# Patient Record
Sex: Female | Born: 1935 | Race: Black or African American | Hispanic: No | Smoking: Never smoker
Health system: Southern US, Community
[De-identification: ages and names within clinical notes are randomized; demographics above are authoritative.]

## PROBLEM LIST (undated history)

## (undated) DIAGNOSIS — I2699 Other pulmonary embolism without acute cor pulmonale: Secondary | ICD-10-CM

## (undated) DIAGNOSIS — E119 Type 2 diabetes mellitus without complications: Secondary | ICD-10-CM

## (undated) DIAGNOSIS — G5602 Carpal tunnel syndrome, left upper limb: Secondary | ICD-10-CM

## (undated) DIAGNOSIS — M1811 Unilateral primary osteoarthritis of first carpometacarpal joint, right hand: Secondary | ICD-10-CM

## (undated) DIAGNOSIS — M5417 Radiculopathy, lumbosacral region: Secondary | ICD-10-CM

## (undated) DIAGNOSIS — M1990 Unspecified osteoarthritis, unspecified site: Secondary | ICD-10-CM

## (undated) DIAGNOSIS — I10 Essential (primary) hypertension: Secondary | ICD-10-CM

## (undated) DIAGNOSIS — K56609 Unspecified intestinal obstruction, unspecified as to partial versus complete obstruction: Secondary | ICD-10-CM

## (undated) DIAGNOSIS — D649 Anemia, unspecified: Secondary | ICD-10-CM

## (undated) DIAGNOSIS — M8580 Other specified disorders of bone density and structure, unspecified site: Secondary | ICD-10-CM

## (undated) DIAGNOSIS — C801 Malignant (primary) neoplasm, unspecified: Secondary | ICD-10-CM

## (undated) DIAGNOSIS — K8689 Other specified diseases of pancreas: Secondary | ICD-10-CM

## (undated) DIAGNOSIS — G5601 Carpal tunnel syndrome, right upper limb: Secondary | ICD-10-CM

## (undated) DIAGNOSIS — E7800 Pure hypercholesterolemia, unspecified: Secondary | ICD-10-CM

## (undated) DIAGNOSIS — C259 Malignant neoplasm of pancreas, unspecified: Secondary | ICD-10-CM

## (undated) DIAGNOSIS — E559 Vitamin D deficiency, unspecified: Secondary | ICD-10-CM

## (undated) DIAGNOSIS — M1710 Unilateral primary osteoarthritis, unspecified knee: Secondary | ICD-10-CM

## (undated) HISTORY — DX: Type 2 diabetes mellitus without complications: E11.9

## (undated) HISTORY — DX: Other pulmonary embolism without acute cor pulmonale: I26.99

## (undated) HISTORY — DX: Radiculopathy, lumbosacral region: M54.17

## (undated) HISTORY — DX: Anemia, unspecified: D64.9

## (undated) HISTORY — DX: Unilateral primary osteoarthritis of first carpometacarpal joint, right hand: M18.11

## (undated) HISTORY — DX: Carpal tunnel syndrome, left upper limb: G56.02

## (undated) HISTORY — DX: Unspecified intestinal obstruction, unspecified as to partial versus complete obstruction: K56.609

## (undated) HISTORY — DX: Other specified diseases of pancreas: K86.89

## (undated) HISTORY — DX: Unilateral primary osteoarthritis, unspecified knee: M17.10

## (undated) HISTORY — DX: Carpal tunnel syndrome, right upper limb: G56.01

## (undated) HISTORY — DX: Malignant neoplasm of pancreas, unspecified: C25.9

## (undated) HISTORY — DX: Essential (primary) hypertension: I10

## (undated) HISTORY — PX: ABDOMINAL HYSTERECTOMY: SHX81

## (undated) HISTORY — DX: Other specified disorders of bone density and structure, unspecified site: M85.80

## (undated) HISTORY — DX: Unspecified osteoarthritis, unspecified site: M19.90

## (undated) HISTORY — DX: Pure hypercholesterolemia, unspecified: E78.00

## (undated) HISTORY — DX: Vitamin D deficiency, unspecified: E55.9

---

## 2004-07-12 ENCOUNTER — Ambulatory Visit: Payer: Self-pay | Admitting: Gastroenterology

## 2005-07-07 ENCOUNTER — Ambulatory Visit: Payer: Self-pay | Admitting: Internal Medicine

## 2005-07-18 ENCOUNTER — Encounter: Payer: Self-pay | Admitting: Internal Medicine

## 2006-07-16 ENCOUNTER — Ambulatory Visit: Payer: Self-pay | Admitting: Internal Medicine

## 2007-07-19 ENCOUNTER — Ambulatory Visit: Payer: Self-pay | Admitting: Family Medicine

## 2007-08-03 ENCOUNTER — Ambulatory Visit: Payer: Self-pay | Admitting: Cardiology

## 2007-10-04 ENCOUNTER — Ambulatory Visit: Payer: Self-pay | Admitting: Gastroenterology

## 2008-07-20 ENCOUNTER — Ambulatory Visit: Payer: Self-pay | Admitting: Family Medicine

## 2009-07-23 ENCOUNTER — Ambulatory Visit: Payer: Self-pay | Admitting: Family Medicine

## 2010-07-30 ENCOUNTER — Ambulatory Visit: Payer: Self-pay | Admitting: Family Medicine

## 2011-08-27 ENCOUNTER — Ambulatory Visit: Payer: Self-pay | Admitting: Internal Medicine

## 2012-08-27 ENCOUNTER — Ambulatory Visit: Payer: Self-pay | Admitting: Internal Medicine

## 2012-12-18 ENCOUNTER — Ambulatory Visit: Payer: Self-pay | Admitting: Orthopedic Surgery

## 2012-12-18 ENCOUNTER — Emergency Department: Payer: Self-pay | Admitting: Emergency Medicine

## 2013-02-14 ENCOUNTER — Ambulatory Visit: Payer: Self-pay | Admitting: General Practice

## 2013-02-23 ENCOUNTER — Ambulatory Visit: Payer: Self-pay | Admitting: Gastroenterology

## 2013-07-29 ENCOUNTER — Ambulatory Visit: Payer: Self-pay | Admitting: Ophthalmology

## 2013-07-29 LAB — CREATININE, SERUM: Creatinine: 1.08 mg/dL (ref 0.60–1.30)

## 2013-09-06 ENCOUNTER — Ambulatory Visit: Payer: Self-pay | Admitting: Internal Medicine

## 2014-02-27 DIAGNOSIS — E119 Type 2 diabetes mellitus without complications: Secondary | ICD-10-CM | POA: Insufficient documentation

## 2014-02-27 HISTORY — DX: Type 2 diabetes mellitus without complications: E11.9

## 2014-09-12 DIAGNOSIS — M5417 Radiculopathy, lumbosacral region: Secondary | ICD-10-CM | POA: Insufficient documentation

## 2014-09-12 DIAGNOSIS — M25552 Pain in left hip: Secondary | ICD-10-CM | POA: Insufficient documentation

## 2014-09-12 HISTORY — DX: Radiculopathy, lumbosacral region: M54.17

## 2014-09-14 ENCOUNTER — Ambulatory Visit: Payer: Self-pay | Admitting: Internal Medicine

## 2015-01-26 NOTE — Consult Note (Signed)
PATIENT NAME:  NOHEMY, KOOP MR#:  [id number] DATE OF BIRTH:  [birth year]  DATE OF CONSULTATION:  12/18/2012  CONSULTING PHYSICIAN:  Mali E. Delitha Elms, MD  REASON FOR CONSULTATION: Right shoulder pain.   HISTORY OF PRESENT ILLNESS: This is a 80 year old female who fell and sustained injury to her right shoulder. She presented with pain and deformity. She complained of pain rated as a 7 out of 10, sharp in nature, exacerbated by movement of the arm and relieved by rest. She denies any injury to the other 3 extremities.  She did not have loss of consciousness. She did not strike her head. No other issues.   PAST MEDICAL HISTORY: Arthritis.   MEDICATIONS: See list in paper chart.   ALLERGIES: No known drug allergies.   SOCIAL HISTORY: The patient denies any alcohol or tobacco use.   FAMILY HISTORY: Noncontributory.   REVIEW OF SYSTEMS: No chest pain. No shortness of breath.   PHYSICAL EXAMINATION: GENERAL: The patient is well-appearing, well-nourished, in no acute distress.   EXTREMITIES: Examination of the right shoulder reveals obvious deformity with anterior/inferior prominence of the humeral head at the glenohumeral joint with a sulcus sign laterally. No erythema. She has tenderness to palpation diffusely about the shoulder. She has limited motion of the shoulder secondary to pain. She has full range of motion of the elbow, 5 out of 5 strength distally, intact sensation axillary, median, radial and ulnar nerve distributions, good capillary refill. Examination of the left shoulder was performed in a similar manner and was free of any abnormalities.   IMAGING: X-rays of the right shoulder revealed anterior dislocation of the shoulder.   IMPRESSION: A 80 year old female with a right shoulder anterior glenohumeral dislocation.   PLAN: The patient will undergo closed reduction in the Emergency Room. She will be placed in a sling. She will follow up with Orthopedics in a week or two.    DESCRIPTION OF PROCEDURE: The patient was seen and identified. The patient was given IV Dilaudid for pain control. Using some slow gentle traction with external rotation, the patient's humeral head was reduced into the glenoid without pain or difficulty. She tolerated this procedure very well. Post reduction radiographs revealed reduction of the glenohumeral joint as verified by the axillary view. She does have evidence of some glenohumeral arthritis with likely a rotator cuff arthropathy.   ____________________________ Mali E. Shantese Raven, MD ces:cb D: 12/19/2012 15:38:27 ET T: 12/19/2012 18:23:35 ET JOB#: [id number]  cc: Mali E. Shenicka Sunderlin, MD, <Dictator> Mali E Alson Mcpheeters MD ELECTRONICALLY SIGNED 01/20/2013 13:09

## 2015-01-27 ENCOUNTER — Inpatient Hospital Stay
Admission: EM | Admit: 2015-01-27 | Discharge: 2015-02-06 | DRG: 330 | Disposition: A | Discharge status: Skilled Nursing Facility | Payer: Medicare Other | Attending: Surgery | Admitting: Surgery

## 2015-01-27 ENCOUNTER — Ambulatory Visit
Admit: 2015-01-27 | Disposition: A | Discharge status: Home / Self Care | Payer: Self-pay | Attending: Family Medicine | Admitting: Family Medicine

## 2015-01-27 DIAGNOSIS — J9811 Atelectasis: Secondary | ICD-10-CM | POA: Diagnosis present

## 2015-01-27 DIAGNOSIS — Z8249 Family history of ischemic heart disease and other diseases of the circulatory system: Secondary | ICD-10-CM | POA: Diagnosis not present

## 2015-01-27 DIAGNOSIS — Z8711 Personal history of peptic ulcer disease: Secondary | ICD-10-CM

## 2015-01-27 DIAGNOSIS — Z90711 Acquired absence of uterus with remaining cervical stump: Secondary | ICD-10-CM | POA: Diagnosis present

## 2015-01-27 DIAGNOSIS — I10 Essential (primary) hypertension: Secondary | ICD-10-CM | POA: Diagnosis present

## 2015-01-27 DIAGNOSIS — K5660 Unspecified intestinal obstruction: Secondary | ICD-10-CM | POA: Diagnosis present

## 2015-01-27 DIAGNOSIS — A047 Enterocolitis due to Clostridium difficile: Secondary | ICD-10-CM | POA: Diagnosis present

## 2015-01-27 DIAGNOSIS — M1990 Unspecified osteoarthritis, unspecified site: Secondary | ICD-10-CM | POA: Diagnosis present

## 2015-01-27 DIAGNOSIS — K56609 Unspecified intestinal obstruction, unspecified as to partial versus complete obstruction: Secondary | ICD-10-CM | POA: Diagnosis present

## 2015-01-27 DIAGNOSIS — Z79899 Other long term (current) drug therapy: Secondary | ICD-10-CM | POA: Diagnosis not present

## 2015-01-27 DIAGNOSIS — Z9049 Acquired absence of other specified parts of digestive tract: Secondary | ICD-10-CM | POA: Diagnosis present

## 2015-01-27 DIAGNOSIS — K7689 Other specified diseases of liver: Secondary | ICD-10-CM | POA: Diagnosis present

## 2015-01-27 DIAGNOSIS — Z90710 Acquired absence of both cervix and uterus: Secondary | ICD-10-CM | POA: Diagnosis present

## 2015-01-27 DIAGNOSIS — Z85038 Personal history of other malignant neoplasm of large intestine: Secondary | ICD-10-CM

## 2015-01-27 LAB — URINALYSIS, COMPLETE
BACTERIA: NEGATIVE
BLOOD: NEGATIVE
GLUCOSE, UR: NEGATIVE
Nitrite: NEGATIVE
Ph: 5 (ref 5.0–8.0)
WBC UR: >30 /HPF (ref 0–5)

## 2015-01-27 LAB — CBC WITH DIFFERENTIAL/PLATELET
Basophil #: 0 10*3/uL (ref 0.0–0.1)
Basophil %: 0.2 %
EOS PCT: 0.1 %
Eosinophil #: 0 10*3/uL (ref 0.0–0.7)
HCT: 37.9 % (ref 35.0–47.0)
HGB: 12.4 g/dL (ref 12.0–16.0)
Lymphocyte #: 0.7 10*3/uL — ABNORMAL LOW (ref 1.0–3.6)
Lymphocyte %: 7.3 %
MCH: 27.6 pg (ref 26.0–34.0)
MCHC: 32.8 g/dL (ref 32.0–36.0)
MCV: 84 fL (ref 80–100)
MONOS PCT: 11.7 %
Monocyte #: 1.2 x10 3/mm — ABNORMAL HIGH (ref 0.2–0.9)
Neutrophil #: 8.3 10*3/uL — ABNORMAL HIGH (ref 1.4–6.5)
Neutrophil %: 80.7 %
Platelet: 286 10*3/uL (ref 150–440)
RBC: 4.5 10*6/uL (ref 3.80–5.20)
RDW: 14.3 % (ref 11.5–14.5)
WBC: 10.3 10*3/uL (ref 3.6–11.0)

## 2015-01-27 LAB — COMPREHENSIVE METABOLIC PANEL
ALBUMIN: 4.5 g/dL
Alkaline Phosphatase: 69 U/L
Anion Gap: 13 (ref 7–16)
BILIRUBIN TOTAL: 0.6 mg/dL
BUN: 46 mg/dL — ABNORMAL HIGH
CREATININE: 1.6 mg/dL — AB
Calcium, Total: 9.8 mg/dL
Chloride: 92 mmol/L — ABNORMAL LOW
Co2: 31 mmol/L
EGFR (African American): 36 — ABNORMAL LOW
EGFR (Non-African Amer.): 31 — ABNORMAL LOW
Glucose: 149 mg/dL — ABNORMAL HIGH
POTASSIUM: 3.5 mmol/L
SGOT(AST): 31 U/L
SGPT (ALT): 16 U/L
SODIUM: 136 mmol/L
TOTAL PROTEIN: 8.2 g/dL — AB

## 2015-01-27 LAB — PROTIME-INR
INR: 1
Prothrombin Time: 13.8 secs

## 2015-01-27 LAB — APTT: Activated PTT: <23 secs — ABNORMAL LOW (ref 23.6–35.9)

## 2015-01-28 LAB — BASIC METABOLIC PANEL
Anion Gap: 8 (ref 7–16)
BUN: 43 mg/dL — AB
CO2: 32 mmol/L
CREATININE: 1.32 mg/dL — AB
Calcium, Total: 8.8 mg/dL — ABNORMAL LOW
Chloride: 95 mmol/L — ABNORMAL LOW
EGFR (African American): 45 — ABNORMAL LOW
EGFR (Non-African Amer.): 39 — ABNORMAL LOW
Glucose: 142 mg/dL — ABNORMAL HIGH
Potassium: 3.5 mmol/L
Sodium: 135 mmol/L

## 2015-01-28 LAB — CBC WITH DIFFERENTIAL/PLATELET
Basophil #: 0 10*3/uL (ref 0.0–0.1)
Basophil %: 0.2 %
EOS ABS: 0.1 10*3/uL (ref 0.0–0.7)
Eosinophil %: 2 %
HCT: 35.7 % (ref 35.0–47.0)
HGB: 11.5 g/dL — ABNORMAL LOW (ref 12.0–16.0)
Lymphocyte #: 0.6 10*3/uL — ABNORMAL LOW (ref 1.0–3.6)
Lymphocyte %: 9.8 %
MCH: 27.5 pg (ref 26.0–34.0)
MCHC: 32.2 g/dL (ref 32.0–36.0)
MCV: 86 fL (ref 80–100)
MONO ABS: 1.2 x10 3/mm — AB (ref 0.2–0.9)
Monocyte %: 18.7 %
NEUTROS ABS: 4.5 10*3/uL (ref 1.4–6.5)
NEUTROS PCT: 69.3 %
PLATELETS: 265 10*3/uL (ref 150–440)
RBC: 4.17 10*6/uL (ref 3.80–5.20)
RDW: 14.2 % (ref 11.5–14.5)
WBC: 6.4 10*3/uL (ref 3.6–11.0)

## 2015-01-29 LAB — BASIC METABOLIC PANEL
ANION GAP: 11 (ref 7–16)
BUN: 29 mg/dL — ABNORMAL HIGH
CALCIUM: 8.6 mg/dL — AB
Chloride: 93 mmol/L — ABNORMAL LOW
Co2: 29 mmol/L
Creatinine: 0.97 mg/dL
EGFR (African American): >60
GFR CALC NON AF AMER: 56 — AB
GLUCOSE: 138 mg/dL — AB
Potassium: 3.4 mmol/L — ABNORMAL LOW
Sodium: 133 mmol/L — ABNORMAL LOW

## 2015-01-29 LAB — CBC WITH DIFFERENTIAL/PLATELET
Basophil #: 0 10*3/uL (ref 0.0–0.1)
Basophil %: 0.2 %
EOS PCT: 1.7 %
Eosinophil #: 0.1 10*3/uL (ref 0.0–0.7)
HCT: 34.5 % — AB (ref 35.0–47.0)
HGB: 11.3 g/dL — AB (ref 12.0–16.0)
Lymphocyte #: 0.7 10*3/uL — ABNORMAL LOW (ref 1.0–3.6)
Lymphocyte %: 10.9 %
MCH: 28.1 pg (ref 26.0–34.0)
MCHC: 32.8 g/dL (ref 32.0–36.0)
MCV: 86 fL (ref 80–100)
MONO ABS: 1.2 x10 3/mm — AB (ref 0.2–0.9)
Monocyte %: 18.9 %
NEUTROS PCT: 68.3 %
Neutrophil #: 4.4 10*3/uL (ref 1.4–6.5)
Platelet: 248 10*3/uL (ref 150–440)
RBC: 4.04 10*6/uL (ref 3.80–5.20)
RDW: 14.1 % (ref 11.5–14.5)
WBC: 6.4 10*3/uL (ref 3.6–11.0)

## 2015-01-30 LAB — CBC WITH DIFFERENTIAL/PLATELET
BASOS PCT: 0.2 %
Basophil #: 0 10*3/uL (ref 0.0–0.1)
EOS PCT: 0 %
Eosinophil #: 0 10*3/uL (ref 0.0–0.7)
HCT: 35.2 % (ref 35.0–47.0)
HGB: 11.7 g/dL — ABNORMAL LOW (ref 12.0–16.0)
LYMPHS ABS: 0.5 10*3/uL — AB (ref 1.0–3.6)
Lymphocyte %: 5 %
MCH: 28 pg (ref 26.0–34.0)
MCHC: 33.2 g/dL (ref 32.0–36.0)
MCV: 84 fL (ref 80–100)
MONO ABS: 1.8 x10 3/mm — AB (ref 0.2–0.9)
Monocyte %: 19.2 %
NEUTROS PCT: 75.6 %
Neutrophil #: 6.9 10*3/uL — ABNORMAL HIGH (ref 1.4–6.5)
Platelet: 273 10*3/uL (ref 150–440)
RBC: 4.17 10*6/uL (ref 3.80–5.20)
RDW: 13.8 % (ref 11.5–14.5)
WBC: 9.1 10*3/uL (ref 3.6–11.0)

## 2015-01-30 LAB — BASIC METABOLIC PANEL
ANION GAP: 10 (ref 7–16)
BUN: 27 mg/dL — AB
Calcium, Total: 8.8 mg/dL — ABNORMAL LOW
Chloride: 95 mmol/L — ABNORMAL LOW
Co2: 27 mmol/L
Creatinine: 1.15 mg/dL — ABNORMAL HIGH
EGFR (African American): 53 — ABNORMAL LOW
EGFR (Non-African Amer.): 46 — ABNORMAL LOW
GLUCOSE: 174 mg/dL — AB
POTASSIUM: 4 mmol/L
Sodium: 132 mmol/L — ABNORMAL LOW

## 2015-01-31 LAB — BASIC METABOLIC PANEL
Anion Gap: 7 (ref 7–16)
BUN: 28 mg/dL — ABNORMAL HIGH
Calcium, Total: 8.2 mg/dL — ABNORMAL LOW
Chloride: 100 mmol/L — ABNORMAL LOW
Co2: 28 mmol/L
Creatinine: 1.19 mg/dL — ABNORMAL HIGH
EGFR (African American): 51 — ABNORMAL LOW
EGFR (Non-African Amer.): 44 — ABNORMAL LOW
Glucose: 142 mg/dL — ABNORMAL HIGH
Potassium: 4.4 mmol/L
Sodium: 135 mmol/L

## 2015-02-01 LAB — CBC WITH DIFFERENTIAL/PLATELET
BASOS PCT: 0.2 %
Basophil #: 0 10*3/uL (ref 0.0–0.1)
EOS ABS: 0.1 10*3/uL (ref 0.0–0.7)
EOS PCT: 2.1 %
HCT: 29.2 % — AB (ref 35.0–47.0)
HGB: 9.3 g/dL — AB (ref 12.0–16.0)
LYMPHS ABS: 0.4 10*3/uL — AB (ref 1.0–3.6)
Lymphocyte %: 5.9 %
MCH: 27.4 pg (ref 26.0–34.0)
MCHC: 31.9 g/dL — AB (ref 32.0–36.0)
MCV: 86 fL (ref 80–100)
MONO ABS: 1.4 x10 3/mm — AB (ref 0.2–0.9)
Monocyte %: 19.9 %
NEUTROS ABS: 5.1 10*3/uL (ref 1.4–6.5)
Neutrophil %: 71.9 %
Platelet: 259 10*3/uL (ref 150–440)
RBC: 3.39 10*6/uL — AB (ref 3.80–5.20)
RDW: 13.8 % (ref 11.5–14.5)
WBC: 7.1 10*3/uL (ref 3.6–11.0)

## 2015-02-01 LAB — BASIC METABOLIC PANEL
Anion Gap: 5 — ABNORMAL LOW (ref 7–16)
BUN: 19 mg/dL
CO2: 28 mmol/L
CREATININE: 0.87 mg/dL
Calcium, Total: 8.2 mg/dL — ABNORMAL LOW
Chloride: 106 mmol/L
Glucose: 153 mg/dL — ABNORMAL HIGH
POTASSIUM: 4.9 mmol/L
Sodium: 139 mmol/L

## 2015-02-03 DIAGNOSIS — K56609 Unspecified intestinal obstruction, unspecified as to partial versus complete obstruction: Secondary | ICD-10-CM | POA: Diagnosis present

## 2015-02-03 HISTORY — DX: Unspecified intestinal obstruction, unspecified as to partial versus complete obstruction: K56.609

## 2015-02-03 LAB — CBC WITH DIFFERENTIAL/PLATELET
Basophil #: 0 10*3/uL (ref 0.0–0.1)
Basophil %: 0.1 %
EOS PCT: 3.3 %
Eosinophil #: 0.3 10*3/uL (ref 0.0–0.7)
HCT: 28.3 % — AB (ref 35.0–47.0)
HGB: 8.9 g/dL — ABNORMAL LOW (ref 12.0–16.0)
LYMPHS PCT: 7 %
Lymphocyte #: 0.6 10*3/uL — ABNORMAL LOW (ref 1.0–3.6)
MCH: 27.1 pg (ref 26.0–34.0)
MCHC: 31.6 g/dL — ABNORMAL LOW (ref 32.0–36.0)
MCV: 86 fL (ref 80–100)
MONOS PCT: 16.4 %
Monocyte #: 1.4 x10 3/mm — ABNORMAL HIGH (ref 0.2–0.9)
Neutrophil #: 6.3 10*3/uL (ref 1.4–6.5)
Neutrophil %: 73.2 %
PLATELETS: 261 10*3/uL (ref 150–440)
RBC: 3.29 10*6/uL — ABNORMAL LOW (ref 3.80–5.20)
RDW: 13.9 % (ref 11.5–14.5)
WBC: 8.6 10*3/uL (ref 3.6–11.0)

## 2015-02-03 LAB — BASIC METABOLIC PANEL
ANION GAP: 4 — AB (ref 7–16)
BUN: 8 mg/dL
CHLORIDE: 106 mmol/L
CREATININE: 0.69 mg/dL
Calcium, Total: 8.3 mg/dL — ABNORMAL LOW
Co2: 26 mmol/L
EGFR (Non-African Amer.): >60
Glucose: 125 mg/dL — ABNORMAL HIGH
Potassium: 4.1 mmol/L
Sodium: 136 mmol/L

## 2015-02-03 MED ORDER — SODIUM CHLORIDE 0.9 % IJ SOLN: 3.0000 mL | INTRAMUSCULAR | Status: DC | PRN

## 2015-02-03 MED ORDER — SODIUM CHLORIDE 0.9 % IJ SOLN: 5.0000 mL | INTRAMUSCULAR | Status: DC | PRN

## 2015-02-03 MED ORDER — KCL IN DEXTROSE-NACL 20-5-0.9 MEQ/L-%-% IV SOLN
INTRAVENOUS | Status: DC
  Administered 2015-02-04 – 2015-02-05 (×3): via INTRAVENOUS
  Filled 2015-02-03 (×7): qty 1000

## 2015-02-03 MED ORDER — MENTHOL 3 MG MT LOZG: 1.0000 | LOZENGE | OROMUCOSAL | Status: DC | PRN

## 2015-02-03 MED ORDER — ACETAMINOPHEN 325 MG PO TABS
650.0000 mg | ORAL_TABLET | Freq: Four times a day (QID) | ORAL | Status: DC | PRN
  Administered 2015-02-05: 650 mg via ORAL
  Filled 2015-02-03: qty 2

## 2015-02-03 MED ORDER — SODIUM CHLORIDE 0.9 % IJ SOLN: 10.0000 mL | INTRAMUSCULAR | Status: DC | PRN

## 2015-02-03 MED ORDER — ENOXAPARIN SODIUM 40 MG/0.4ML ~~LOC~~ SOLN
40.0000 mg | SUBCUTANEOUS | Status: DC
  Administered 2015-02-04 – 2015-02-06 (×3): 40 mg via SUBCUTANEOUS
  Filled 2015-02-03 (×3): qty 0.4

## 2015-02-03 MED ORDER — METOPROLOL TARTRATE 1 MG/ML IV SOLN
2.5000 mg | Freq: Three times a day (TID) | INTRAVENOUS | Status: DC
  Administered 2015-02-04 – 2015-02-05 (×4): 2.5 mg via INTRAVENOUS
  Filled 2015-02-03 (×4): qty 5

## 2015-02-03 MED ORDER — ONDANSETRON HCL 4 MG/2ML IJ SOLN: 4.0000 mg | INTRAMUSCULAR | Status: DC | PRN

## 2015-02-03 MED ORDER — POTASSIUM CHLORIDE 2 MEQ/ML IV SOLN: INTRAVENOUS | Status: DC

## 2015-02-03 MED ORDER — FAMOTIDINE IN NACL 20-0.9 MG/50ML-% IV SOLN
20.0000 mg | Freq: Two times a day (BID) | INTRAVENOUS | Status: DC
  Filled 2015-02-03 (×2): qty 50

## 2015-02-03 MED ORDER — HYDROMORPHONE HCL 1 MG/ML IJ SOLN: 0.5000 mg | INTRAMUSCULAR | Status: DC | PRN

## 2015-02-03 MED ORDER — SODIUM CHLORIDE 0.9 % IJ SOLN: 10.0000 mL | Freq: Two times a day (BID) | INTRAMUSCULAR | Status: DC

## 2015-02-03 MED ORDER — SODIUM CHLORIDE 0.9 % IJ SOLN
5.0000 mL | Freq: Two times a day (BID) | INTRAMUSCULAR | Status: DC
  Administered 2015-02-04 – 2015-02-06 (×5): 5 mL via INTRAVENOUS

## 2015-02-04 MED ORDER — FAMOTIDINE PREMIXED 20-0.9 MG/50ML-% IV SOLN
20.0000 mg | Freq: Two times a day (BID) | INTRAVENOUS | Status: DC
  Administered 2015-02-04: 20 mg via INTRAVENOUS
  Filled 2015-02-04 (×2): qty 50

## 2015-02-04 NOTE — Progress Notes (Signed)
Surgery  POD 5  S/P ex lap, small bowel bypass for SBO  No c/o's some inc pain.  No CP/SOB. Family concerned about stability walking and safety if goes home with her sister.  Filed Vitals:   02/02/15 1403 02/04/15 0420 02/04/15 0859  BP:  131/76 112/76  Pulse:   116  Temp:  98.4 F (36.9 C) 97.6 F (36.4 C)  TempSrc:  Oral Oral  Resp:  20 20  Height: 5' 5.98" (1.676 m)    Weight: 80.1 kg (176 lb 9.4 oz)    SpO2:   98%      Good UOP Lungs clear abd soft, wound ok, no drainage  Labs as above.  IMP  Making good progress  PLan  Advance diet CM to see in am for possible rehab placement. Discussed with family in detail.

## 2015-02-04 NOTE — Op Note (Addendum)
PATIENT NAME:  Carolyn Adkins, Carolyn Adkins MR#:  [id number] DATE OF BIRTH:  Dec 01, 1934  DATE OF PROCEDURE:  01/30/2015  PREOPERATIVE DIAGNOSIS: Small bowel obstruction.   POSTOPERATIVE DIAGNOSIS: Small bowel obstruction.   PROCEDURE PERFORMED: Lysis of adhesions and small bowel bypass.  SURGEON: Rodena Goldmann, MD   ANESTHESIA: General.   OPERATIVE PROCEDURE: With the patient in the supine position, after the induction of appropriate general anesthesia, the patient's abdomen was prepped with Betadine and draped in sterile towels. An alcohol wipe and Betadine impregnated Steri-Drape were utilized. A Foley catheter previously had been placed. A midline incision was made from just above the umbilicus to just below the umbilicus and carried down through the subcutaneous tissue with Bovie electrocautery. Midline fascia was identified and opened the length of the skin incision as was the peritoneum. Multiple adhesions were encountered. There was significant size mismatch between the proximal and distal bowel. Following the bowel distally there was adhesion to the anterior abdominal wall with small bowel twisted about the adhesion and multiple lesions of the pelvis. The bowel was lysed off the abdominal wall and the pelvic adhesions were removed. The bowel was then brought up into the abdominal cavity and run from the ileocecal valve to the ligament of Treitz. The area of obstruction appeared to be chronic with scarring across that segment of bowel. We elected to do a bypass on that segment of bowel rather than a resection. A small enterotomy was made in each loop of bowel and the GIA-55 stapling device inserted, 1 limb in each loop of bowel. The stapler was approximated and fired. The enterotomy was closed with 2-layer closure running 3-0 Vicryl suture and seromuscular sutures of 3-0 silk. An apex suture of 3-0 silk was placed. Bowel content was returned to their anatomic position. The abdomen was copiously irrigated. Gown,  gloves, instruments and drapes were changed. A nasogastric tube was appropriately positioned. Midline fascia was closed with figure-of-eight suture of 0 Maxon. A drain was placed in the bed of the incision and the skin clipped. Sterile dressings were applied.   Attention was then turned to the patient's right neck where a right internal jugular central venous catheter was inserted using ultrasound guidance on a single pass. The catheter was sutured in place, dressed sterilely and flushed. The patient was then awakened and returned to the recovery room having tolerated the procedure well. Sponge, instrument and needle counts were correct x2 in the operating room.   ____________________________ Micheline Maze, MD rle:sb D: 01/30/2015 13:30:35 ET T: 01/30/2015 14:20:09 ET JOB#: [id number]  cc: Micheline Maze, MD, <Dictator> Christena Flake. Raechel Ache, MD Rodena Goldmann MD ELECTRONICALLY SIGNED 02/19/2015 14:30

## 2015-02-04 NOTE — Clinical Social Work Note (Signed)
Clinical Social Work Assessment  Patient Details  Name: Carolyn Adkins MRN: [id number] Date of Birth: [birth year]  Date of referral:  02/04/15               Reason for consult:  Facility Placement                Permission sought to share information with:  Family Supports Permission granted to share information::  Yes, Verbal Permission Granted  Name::        Agency::     Relationship::     Contact Information:     Housing/Transportation Living arrangements for the past 2 months:  Single Family Home Source of Information:  Patient Patient Interpreter Needed:  None Criminal Activity/Legal Involvement Pertinent to Current Situation/Hospitalization:  No - Comment as needed Significant Relationships:  Siblings, Adult Children Lives with:  Self Do you feel safe going back to the place where you live?  Yes Need for family participation in patient care:  No (Coment)  Care giving concerns:     Facilities manager / plan:  CSW met with pt re: PT recommendation for SNF. 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 CSW explained placement process and answered questions. Pt prefers CSW initiate SNF search in [place] and [place] in [place]. Weekday CSW to f/u with offers.   Employment status:  Part-Time Nurse, adult PT Recommendations:  [place] / Referral to community resources:     Patient/Family's Response to care:  Pt reports agreeable to above plan and that her family is aware of d/c plan to SNF.  Patient/Family's Understanding of and Emotional Response to Diagnosis, Current Treatment, and Prognosis:    Emotional Assessment Appearance:    Attitude/Demeanor/Rapport:    Affect (typically observed):  Accepting, Adaptable, Appropriate, Calm Orientation:  Oriented to Self, Oriented to Place, Oriented to  Time, Oriented to Situation Alcohol / Substance use:  Never Used Psych involvement (Current  and /or in the community):  No (Comment)  Discharge Needs  Concerns to be addressed:  Discharge Planning Concerns Readmission within the last 30 days:  No Current discharge risk:  None Barriers to Discharge:  Other (medical clearance)   [name], MSW, LCSW [phone] (weekend coverage)       Amador Cunas, [place] 02/04/2015, 3:03 PM

## 2015-02-04 NOTE — Care Management Note (Signed)
Case Management Note  Patient Details  Name: Carolyn Adkins MRN: [id number] Date of Birth: 03-24-1935  Subjective/Objective:         LTAC Referral           Action/Plan:  Carolyn Adkins will provide an LTAC evaluation for [facility] on 02/05/15.   Expected Discharge Date:   undecided               Expected Discharge Plan:     In-House Referral:     Discharge planning Services     Post Acute Care Choice:    Choice offered to:     DME Arranged:    DME Agency:     HH Arranged:    [place] Agency:     Status of Service:     Medicare Important Message Given:    Date Medicare IM Given:    Medicare IM give by:    Date Additional Medicare IM Given:    Additional Medicare Important Message give by:     If discussed at [place] of Stay Meetings, dates discussed:    Additional Comments:  Carolyn Adkins A, RN 02/04/2015, 1:52 PM

## 2015-02-04 NOTE — Consult Note (Signed)
PATIENT NAME:  Carolyn, Adkins MR#:  [id number] DATE OF BIRTH:  July 03, 1935  DATE OF CONSULTATION:  01/29/2015  REFERRING PHYSICIAN:  Dr. Pat Patrick CONSULTING PHYSICIAN:  Desyre Calma R. Evann Koelzer, MD  REASON FOR CONSULTATION: Preoperative evaluation, hypertension.   PRIMARY CARE PROVIDER: Christena Flake. [facility], MD   HISTORY OF PRESENTING ILLNESS: A 80 year old African American female patient with history of colon polyps status post colectomy 2004, hypertension, hysterectomy, presented to the Emergency Room 2 days prior on 01/27/2015 with complaints of 2 days of nausea and vomiting. The patient was found to have small bowel obstruction and admitted onto the surgical service by Dr. Marina Gravel. The patient presently has an NG tube. Does not complain of any pain, but with no improvement in her abdominal x-ray. Plan is to take her to the Operating Room by Dr. Pat Patrick. A consult has been placed for hospitalist team for preoperative evaluation and risk assessment.   Presently, the patient seems comfortable. Has been confused since receiving her morphine. Most of the history has been obtained from her daughter over the phone, old records, and reviewing the chart. The patient does not complain of any shortness of breath or chest pain. Seems to ambulate on her own without any shortness of breath or chest pain at baseline. Continues to work.   Since admission, the patient has slowly trended up with her heart rate and presently is in the 130s. Blood pressure is in the normal range at 125/85 and saturating to 93% on room air.   PAST MEDICAL HISTORY: 1.  Hypertension.  2.  Arthritis.  3.  Hysterectomy.  4.  Appendectomy.  5.  Right colectomy in 2004 for a colon polyp.   ALLERGIES: No known drug allergies.   HOME MEDICATIONS:  1.  Vitamins D3 at 400 international units daily.  2.  Omeprazole 20 mg daily.  3.  Multivitamin 1 tablet daily.  4.  Diclofenac sodium 75 mg 2 times a day.  5.  Calcium vitamin D 1 tablet daily.  6.  Atenolol  chlorthalidone 1 tablet oral once a day.  7.  Acetaminophen 500 mg every 8 hours as needed.   FAMILY HISTORY: Hypertension and CAD.   REVIEW OF SYSTEMS: Unobtainable secondary to the patient's confusion and encephalopathy.   SOCIAL HISTORY: The patient does not smoke. No alcohol. No illicit drugs. Lives at home, continues to work, independent with activities of daily living.   CODE STATUS: Full code.   PHYSICAL EXAMINATION: VITAL SIGNS: Temperature 99, heart rate of 138, blood pressure 125/85, saturating 93% on room air.  GENERAL: Obese African American female patient lying in bed, overall seems comfortable.  PSYCHIATRIC: Alert, awake, pleasantly confused.  HEENT: Atraumatic, normocephalic. Oral mucosa dry and pink. External ears and nose normal. Has an NG tube in place.  NECK: Supple. No thyromegaly. No palpable lymph nodes. Trachea midline. No carotid bruit or JVD.  CARDIOVASCULAR: S1, S2, tachycardic. No murmurs. Peripheral pulses 2+. No edema.  RESPIRATORY: Normal work of breathing. Has some decreased air entry at the right base.  GASTROINTESTINAL: Soft abdomen, nontender. Bowel sounds absent. No hepatosplenomegaly palpable. Scar from prior surgery.  GENITOURINARY: No CVA tenderness or bladder distention.  SKIN: Warm and dry. No petechia, rash.  MUSCULOSKELETAL: No joint swelling, redness, effusion of the large joints. Normal muscle tone.  NEUROLOGICAL: Motor strength 5/5 in upper and lower extremities. Sensation to fine touch intact all over.  LYMPHATIC: No cervical lymphadenopathy.    LABORATORY STUDIES: Glucose of 138, BUN 29, creatinine 0.97 with  sodium 133, potassium 3.4, chloride 93, and GFR of 56, initial creatinine of 1.6.   WBC 6.4, hemoglobin 11.3, platelets of 248,000.   Urinalysis showed no bacteria.   CT scan of the abdomen and x-ray of the abdomen have been reviewed, shows small bowel obstruction.   ASSESSMENT AND PLAN: 1.  Small bowel obstruction. The patient  does not have any cardiac or lung issues. Seems to have good functional status at baseline. At this point, she should be a low risk for surgery, but we will get an EKG and a chest x-ray. If these are normal, the patient can go to the Operating Room. I have ordered these studies and will follow up on them. Discussed the complications with her daughter over the phone.  2.  Hypertension and tachycardia. The patient is on atenolol at home. We will put her on IV Lopressor as she is n.p.o. and monitor her heart rate.  3.  Acute renal failure has resolved with IV fluids. Continue fluids.  4.  Acute encephalopathy secondary to her morphine. The patient should be on fall precautions. We will get a CT scan of the head if there is no improvement.  5.  Deep vein thrombosis prophylaxis. The patient is on Lovenox.   CODE STATUS: Full code.   TIME SPENT ON THIS CONSULTATION: 45 minutes.  EKG and CXR reviewed. Nothing acute. Can proceed with surgery.   ____________________________ Carolyn Adkins Maryana Pittmon, MD srs:at D: 01/29/2015 19:17:53 ET T: 01/30/2015 10:39:37 ET JOB#: [id number]  cc: Carolyn Heimlich R. Ameerah Huffstetler, MD, <Dictator> Carolyn Flake. Raechel Ache, MD Carolyn Maze, MD Carolyn Carp MD ELECTRONICALLY SIGNED 01/30/2015 19:57

## 2015-02-04 NOTE — Clinical Social Work Placement (Signed)
   CLINICAL SOCIAL WORK PLACEMENT  NOTE  Date:  02/04/2015  Patient Details  Name: Carolyn Adkins MRN: [id number] Date of Birth: 05-06-1935  Clinical Social Work is seeking post-discharge placement for this patient at the [facility] level of care (*CSW will initial, date and re-position this form in  chart as items are completed):  Yes   Patient/family provided with [facility] Work Department's list of facilities offering this level of care within the geographic area requested by the patient (or if unable, by the patient's family).  Yes   Patient/family informed of their freedom to choose among providers that offer the needed level of care, that participate in Medicare, Medicaid or managed care program needed by the patient, have an available bed and are willing to accept the patient.  Yes   Patient/family informed of [facility]'s ownership interest in [facility] and [facility], as well as of the fact that they are under no obligation to receive care at these facilities.  PASRR submitted to EDS on 02/04/15     PASRR number received on 02/04/15     Existing PASRR number confirmed on       FL2 transmitted to all facilities in geographic area requested by pt/family on 02/04/15     FL2 transmitted to all facilities within larger geographic area on       Patient informed that his/her managed care company has contracts with or will negotiate with certain facilities, including the following:            Patient/family informed of bed offers received.  Patient chooses bed at       Physician recommends and patient chooses bed at      Patient to be transferred to   on  .  Patient to be transferred to facility by       Patient family notified on   of transfer.  Name of family member notified:        PHYSICIAN       Additional Comment:    _______________________________________________ Amador Cunas, LCSW 02/04/2015, 3:06  PM

## 2015-02-04 NOTE — H&P (Signed)
Subjective/Chief Complaint nausea and vomiting   History of Present Illness 80 y.o female presents with abou 2 days of n/v, last flatus this am. Prior right colectomy for polyp 2004 as well as a hysterectomy in past,  No prior episodes of SBO.  Imaging shows transition point in RLQ,  Surgery asked to evaluate for admission No fevers, no jaundice, no diarrhea.   Past History colon polyp right colectomy 2004 hysterectomy.   Past Medical Health Hypertension   Primary Physician [facility]. [place].   Code Status Full Code   Past Med/Surgical Hx:  HTN:   Arthritis:   Hysterectomy - Partial:   Appendectomy:   ALLERGIES:  No Known Allergies:    Other Allergies none.   HOME MEDICATIONS: Medication Instructions Status  atenolol-chlorthalidone 50 mg-25 mg oral tablet 1 tab(s) orally once a day Active  diclofenac sodium 75 mg oral delayed release tablet 1 tab(s) orally 2 times a day Active  multivitamin   once a day Active  Vitamin D3 400 intl units oral capsule 1 cap(s) orally once a day Active   Family and Social History:  Family History Hypertension   Social History negative tobacco, negative ETOH   Place of Living Home   Review of Systems:  Subjective/Chief Complaint see abovem remaining 10 point ROS negative.   Abdominal Pain Yes   Nausea/Vomiting Yes   Tolerating Diet No  Nauseated  Vomiting   Medications/Allergies Reviewed Medications/Allergies reviewed   Physical Exam:  GEN no acute distress, obese, disheveled, temp 97.7 p 60 bp 110/68   HEENT pale conjunctivae, PERRL   NECK supple   RESP normal resp effort  clear BS   CARD regular rate   ABD denies tenderness  no liver/spleen enlargement  no hernia  soft  distended   LYMPH negative neck   EXTR negative cyanosis/clubbing, negative edema   SKIN normal to palpation   NEURO cranial nerves intact   PSYCH A+O to time, place, person, good insight   Lab Results: Routine Coag:   23-Apr-16 14:48   Prothrombin 13.8 (11.4-15.0 NOTE: New Reference Range  11/03/14)  INR 1.0 (INR reference interval applies to patients on anticoagulant therapy. A single INR therapeutic range for coumarins is not optimal for all indications; however, the suggested range for most indications is 2.0 - 3.0. Exceptions to the INR Reference Range may include: Prosthetic heart valves, acute myocardial infarction, prevention of myocardial infarction, and combinations of aspirin and anticoagulant. The need for a higher or lower target INR must be assessed individually. Reference: The Pharmacology and Management of the Vitamin K  antagonists: the seventh ACCP Conference on Antithrombotic and Thrombolytic Therapy. FSFSE.3953 Sept:126 (3suppl): [id number]. A HCT value >55% may artifactually increase the PT.  In one study,  the increase was an average of 25%. Reference:  "Effect on Routine and Special Coagulation Testing Values of Citrate Anticoagulant Adjustment in Patients with High HCT Values." American Journal of Clinical Pathology 2006;126:400-405.)  Activated PTT (APTT)  < 23.0 (A HCT value >55% may artifactually increase the APTT. In one study, the increase was an average of 19%. Reference: "Effect on Routine and Special Coagulation Testing Values of Citrate Anticoagulant Adjustment in Patients with High HCT Values." American Journal of Clinical Pathology 2006;126:400-405.)   Radiology Results: LabUnknown:    23-Apr-16 14:38, CT Abdomen and Pelvis With Contrast  PACS Image  CT:  CT Abdomen and Pelvis With Contrast  REASON FOR EXAM:    (1) pain; (2) pain  COMMENTS:  PROCEDURE: CT  - CT ABDOMEN / PELVIS  W  - Jan 27 2015  2:38PM     CLINICAL DATA:  Abdominal pain and distention, small bowel  obstruction, nausea and vomiting for 3 days. Prior hysterectomy,  appendectomy, colon cancer.    EXAM:  CT ABDOMEN AND PELVIS WITH CONTRAST    TECHNIQUE:  Multidetector CT imaging of  the abdomen and pelvis was performed  using the standard protocol following bolus administration of  intravenous contrast.    CONTRAST: 75 cc Isovue 300 IV contrast    COMPARISON:  Abdominal radiographs same date. No prior CT  comparison.    FINDINGS:  Lower chest: Bibasilar bronchial wall thickening and areas of  curvilinear scarring or atelectasis are noted.    Hepatobiliary: 1 cmcyst at the dome of the lateral segment left  hepatic lobe is identified image 15. Mild central intrahepatic  ductal prominence is identified. Gallbladder is unremarkable.  Pancreas: Partly atrophic pancreas appears normal otherwise.    Spleen: Normal    Adrenals/Urinary Tract: Adrenal glands are normal. 0.9 cm anterior  mid right renal cortical cyst image 28. Areas of mild bilateral  renal cortical scarring. Left kidney otherwise unremarkable. No  hydroureteronephrosis.    Stomach/Bowel: There is diffuse proximal to mid small bowel  dilatation to a maximal caliber of 4.5 cm image 60. Transition point  is identified at the approximate level of the proximal ileum, with  approximate transition point at the anterior mid abdomen, subjacent  to the anterior abdominal wall, image 56. Most likely etiology is  adhesion from adjacent previous midline surgical incision. Terminal  ileum and large bowel are decompressed.    Vascular/Lymphatic: No atheromatous scratch but no aortic aneurysm.  Small retroperitoneal nodes are identified, largest 0.6 cm image 36.    Reproductive: Uterus surgically absent.  No adnexal mass.    Other: No free air.  A small amount of free fluid is identified.    Musculoskeletal: No acute osseous abnormality.     IMPRESSION:  High-grade small bowel obstruction with transition point at the  approximate level of the proximal ileum, difficult to visualize with  certainty but most likely subjacent to the anterior abdominal wall  midline and most likely etiology due to adhesion from  previous  midline surgical incision.    Small free fluid in the pelvis.  No free air.      Electronically Signed    By: Conchita Paris M.D.    On: 01/27/2015 14:52         Verified By: Arline Asp, M.D.,    Assessment/Admission Diagnosis 80 y.o female with adhesive SBO Hypertension Mild azotemia. hypokalemic metabolic alkalosis from vomiting.   Plan Admit NPO NGT Hydrate am films.  CT scan images personally reviewed on PACS monitor.   Electronic Signatures: Sherri Rad (MD)  (Signed 23-Apr-16 15:57)  Authored: CHIEF COMPLAINT and HISTORY, PAST MEDICAL/SURGIAL HISTORY, ALLERGIES, Other Allergies, HOME MEDICATIONS, FAMILY AND SOCIAL HISTORY, REVIEW OF SYSTEMS, PHYSICAL EXAM, LABS, Radiology, ASSESSMENT AND PLAN   Last Updated: 23-Apr-16 15:57 by Sherri Rad (MD)

## 2015-02-05 LAB — CLOSTRIDIUM DIFFICILE BY PCR: Toxigenic C. Difficile by PCR: POSITIVE — AB

## 2015-02-05 LAB — C DIFFICILE QUICK SCREEN W PCR REFLEX
C Diff antigen: POSITIVE
C Diff toxin: NEGATIVE

## 2015-02-05 MED ORDER — HYDROCHLOROTHIAZIDE 25 MG PO TABS: 25.0000 mg | ORAL_TABLET | Freq: Two times a day (BID) | ORAL | Status: DC

## 2015-02-05 MED ORDER — ATENOLOL 50 MG PO TABS
50.0000 mg | ORAL_TABLET | Freq: Every day | ORAL | Status: DC
  Administered 2015-02-05 – 2015-02-06 (×2): 50 mg via ORAL
  Filled 2015-02-05 (×2): qty 1

## 2015-02-05 MED ORDER — HYDROCHLOROTHIAZIDE 25 MG PO TABS
25.0000 mg | ORAL_TABLET | Freq: Every day | ORAL | Status: DC
  Administered 2015-02-05: 25 mg via ORAL
  Filled 2015-02-05: qty 1

## 2015-02-05 MED ORDER — ATENOLOL 50 MG PO TABS: 50.0000 mg | ORAL_TABLET | Freq: Two times a day (BID) | ORAL | Status: DC

## 2015-02-05 MED ORDER — METRONIDAZOLE 500 MG PO TABS
500.0000 mg | ORAL_TABLET | Freq: Three times a day (TID) | ORAL | Status: DC
  Administered 2015-02-05 – 2015-02-06 (×4): 500 mg via ORAL
  Filled 2015-02-05 (×4): qty 1

## 2015-02-05 NOTE — Clinical Social Work Note (Signed)
Patient has had bed offers extended to her and she has requested I contacted her sister: Barbaraann Share: [phone]. Barbaraann Share chose [facility] and patient is in agreement with this. [facility] informed of acceptance of offer. Will facilitate discharge when time.

## 2015-02-05 NOTE — Progress Notes (Signed)
Physical Therapy Treatment Patient Details Name: Carolyn Adkins MRN: [id number] DOB: Feb 20, 1935 Today's Date: 02/05/2015    History of Present Illness 80 yo female with onset of SBO with findings of liver cyst, bibasilar atelectasis, bronchial wall thickening is referred to PT and planned for SNF.  PMHx:  colon CA, R colectomy, colon polyp, HTN    PT Comments    Pt is having a limited day for effort but did start gait with PT.  Her tolerance is reduced by abd pain and did ask nursing to medicate her.  SNF plans are in place and SW was in to get her input on a facility.  WIll focus on ROM to legs and standing tolerance/gait next visit.  Follow Up Recommendations  SNF;Supervision/Assistance - 24 hour     Equipment Recommendations  Rolling walker with 5" wheels    Recommendations for Other Services       Precautions / Restrictions Precautions Precautions: Fall Restrictions Weight Bearing Restrictions: No    Mobility  Bed Mobility Overal bed mobility: Needs Assistance Bed Mobility: Supine to Sit;Sit to Supine     Supine to sit: Mod assist Sit to supine: Mod assist   General bed mobility comments: Pt is having abd pain which is limiting her exertional factor to use abd mm's  Transfers Overall transfer level: Needs assistance Equipment used: Rolling walker (2 wheeled);1 person hand held assist Transfers: Sit to/from Stand Sit to Stand: Mod assist         General transfer comment: Pt is demonstrating some good effort and needs to be reminded of hand placement  Ambulation/Gait Ambulation/Gait assistance: Mod assist;Min assist Ambulation Distance (Feet): 6 Feet Assistive device: Rolling walker (2 wheeled);1 person hand held assist Gait Pattern/deviations: Step-to pattern;Decreased step length - right;Decreased step length - left;Wide base of support;Trunk flexed Gait velocity: slow Gait velocity interpretation: Below normal speed for age/gender General Gait Details:  sidesteps wtih RW and assist to move the walker and then back to bed as pt is in pain   Stairs            Wheelchair Mobility    Modified Rankin (Stroke Patients Only)       Balance Overall balance assessment: Needs assistance Sitting-balance support: Feet supported Sitting balance-Leahy Scale: Fair   Postural control: Posterior lean Standing balance support: Bilateral upper extremity supported Standing balance-Leahy Scale: Poor Standing balance comment: weak and in pain abd                    Cognition Arousal/Alertness: Awake/alert Behavior During Therapy: WFL for tasks assessed/performed Overall Cognitive Status: Within Functional Limits for tasks assessed                      Exercises General Exercises - Lower Extremity Ankle Circles/Pumps: AROM;Both;5 reps Heel Slides: AROM;AAROM;Both;10 reps Hip Flexion/Marching: AROM;Both;10 reps    General Comments General comments (skin integrity, edema, etc.): Pt has been in bed and noted her knees are stiff, with poor flexion due to lack of movement       Pertinent Vitals/Pain Pain Assessment: 0-10 Pain Score: 8  Pain Location: abdomen Pain Intervention(s): Limited activity within patient's tolerance;Monitored during session;Repositioned;Patient requesting pain meds-RN notified    Home Living                      Prior Function            PT Goals (current goals can now be found  in the care plan section) Acute Rehab PT Goals Patient Stated Goal: To get home Progress towards PT goals: Progressing toward goals    Frequency  Min 2X/week    PT Plan Current plan remains appropriate    Co-evaluation             End of Session Equipment Utilized During Treatment: Oxygen Activity Tolerance: Patient tolerated treatment well;Patient limited by pain Patient left: in bed;with call bell/phone within reach;with bed alarm set     Time: 1440-1514 PT Time Calculation (min) (ACUTE  ONLY): 34 min  Charges:  $Gait Training: 8-22 mins $Therapeutic Exercise: 8-22 mins                    G Codes:      Ramond Dial 01-Mar-2015, 4:48 PM  Mee Hives, PT MS Acute Rehab Dept. Number: [phone]

## 2015-02-05 NOTE — Progress Notes (Signed)
Nutrition Follow-up  DOCUMENTATION CODES:     INTERVENTION:  Medical Nutrition supplement Therapy: Magic Cup BID for added nutrition Meals and Snacks: Cater to patient preferences  NUTRITION DIAGNOSIS:  Inadequate oral intake related to altered GI function as evidenced by NPO status, but Being addressed with advancement in diet order, supplements ordered, pt eating 50-100% of meals  GOAL:  Energy Intake: goal for pt to eat atleast 75% of estimated energy needs  MONITOR:  Energy Intake Anthropometrics Electrolyte and renal Profile Glucose Profile Digestive system  REASON FOR ASSESSMENT:  NPO/Clear Liquid Diet    ASSESSMENT:  Pt reports reports eating 50% of breakfast this am including 100% of grits, recorded po intake 100% of dinner and 50% of breakfast yesterday.  Noted pt with multiple watery stools, now c.diff positive.  Labs Glucose Profile: No results for input(s): GLUCAP in the last 72 hours.  Electrolyte and Renal Profile:    Recent Labs Lab 01/31/15 0508 02/01/15 0538 02/03/15 0355  BUN 28* 19 8  CREATININE 1.19* 0.87 0.69  NA 135 139 136  K 4.4 4.9 4.1   Medications D5 NS with KCl at 76mL/hr (providing 204kcals in 24 hours)  Height:  Ht Readings from Last 1 Encounters:  02/02/15 5' 5.98" (1.676 m)    Weight:  Wt Readings from Last 1 Encounters:  02/02/15 176 lb 9.4 oz (80.1 kg)    Ideal Body Weight:     Wt Readings from Last 10 Encounters:  02/02/15 176 lb 9.4 oz (80.1 kg)    BMI:  Body mass index is 28.52 kg/(m^2).  Estimated Nutritional Needs:  Kcal:  1720-2033kcals   Protein:  80-96g protein (1.0-1.2g/kg)  Fluid:  2002-2429mL of fluid (25-45mL/kg)   Diet Order:  Diet regular Room service appropriate?: Yes; Fluid consistency:: Thin  EDUCATION NEEDS:  No education needs identified at this time   Intake/Output Summary (Last 24 hours) at 02/05/15 1430 Last data filed at 02/05/15 1000  Gross per 24 hour  Intake    1513 ml  Output      0 ml  Net   1513 ml    Last BM:  Multiple watery stools  MODERATE Care Level  Dwyane Luo, RD, LDN Pager [phone]

## 2015-02-05 NOTE — Progress Notes (Signed)
  POD 6 Having foul-smelling diarrhea Eating, but early satiety No significant abdominal pain  Vital signs in last 24 hours: Temp:  [98.3 F (36.8 C)-99.4 F (37.4 C)] 99 F (37.2 C) (05/02 1126) Pulse Rate:  [77-121] 111 (05/02 1126) Resp:  [18-20] 18 (05/02 1126) BP: (109-130)/(59-84) 109/59 mmHg (05/02 1126) SpO2:  [95 %-99 %] 95 % (05/02 1126) Last BM Date: 02/04/15  Intake/Output from previous day: 05/01 0701 - 05/02 0700 In: 1498 [P.O.:480; I.V.:1018] Out: 200 [Urine:200] Intake/Output this shift: Total I/O In: 366 [I.V.:366] Out: -   GI: soft, non-tender; bowel sounds normal; no masses,  no organomegaly  Lab Results:  CBC  Recent Labs  02/03/15 0355  WBC 8.6  HGB 8.9*  HCT 28.3*  PLT 261   CMP     Component Value Date/Time   NA 136 02/03/2015 0355   K 4.1 02/03/2015 0355   CL 106 02/03/2015 0355   CO2 26 02/03/2015 0355   GLUCOSE 125* 02/03/2015 0355   BUN 8 02/03/2015 0355   CREATININE 0.69 02/03/2015 0355   CALCIUM 8.3* 02/03/2015 0355   PROT 8.2* 01/27/2015 0921   ALBUMIN 4.5 01/27/2015 0921   AST 31 01/27/2015 0921   ALT 16 01/27/2015 0921   ALKPHOS 69 01/27/2015 0921   GFRNONAA >60 02/03/2015 0355   GFRAA >60 02/03/2015 0355   PT/INR No results for input(s): LABPROT, INR in the last 72 hours. ABG No results for input(s): PHART, HCO3 in the last 72 hours.  Invalid input(s): PCO2, PO2  Studies/Results: No results found.  Assessment/Plan: s/p * No surgery found * Started on Flagyl for C Diff early this AM No changes

## 2015-02-06 ENCOUNTER — Encounter
Admission: RE | Admit: 2015-02-06 | Discharge: 2015-02-06 | Disposition: A | Discharge status: Home / Self Care | Payer: Medicare Other | Source: Ambulatory Visit | Attending: Internal Medicine | Admitting: Internal Medicine

## 2015-02-06 NOTE — Clinical Social Work Note (Signed)
Patient to discharge today to [facility]. Joelene Millin at [facility] is aware. Orders sent and nurse to call report. Patient wishes to transport via EMS and is in agreement with discharge. Patient notified her sister of transport.

## 2015-02-06 NOTE — Clinical Social Work Placement (Signed)
° °  CLINICAL SOCIAL WORK PLACEMENT  NOTE  Date:  02/06/2015  Patient Details  Name: Carolyn Adkins MRN: [id number] Date of Birth: 1935/02/23  Clinical Social Work is seeking post-discharge placement for this patient at the [place] level of care (*CSW will initial, date and re-position this form in  chart as items are completed):  Yes   Patient/family provided with [facility]artments list of facilities offering this level of care within the geographic area requested by the patient (or if unable, by the patients family).  Yes   Patient/family informed of their freedom to choose among providers that offer the needed level of care, that participate in Medicare, Medicaid or managed care program needed by the patient, have an available bed and are willing to accept the patient.  Yes   Patient/family informed of [facility] ownership interest in [facility] and [facility], as well as of the fact that they are under no obligation to receive care at these facilities.  PASRR submitted to EDS on 02/04/15     PASRR number received on 02/04/15     Existing PASRR number confirmed on       FL2 transmitted to all facilities in geographic area requested by pt/family on 02/04/15     FL2 transmitted to all facilities within larger geographic area on       Patient informed that his/her managed care company has contracts with or will negotiate with certain facilities, including the following:        Yes   Patient/family informed of bed offers received.  Patient chooses bed at  [facility])     Physician recommends and patient chooses bed at  [facility])    Patient to be transferred to  [facility]) on 02/06/15.  Patient to be transferred to facility by  (EMS)     Patient family notified on 02/06/15 of transfer.  Name of family member notified:   (patient notified sister)     PHYSICIAN       Additional Comment:     _______________________________________________ Kirkland Hun 02/06/2015, 2:20 PM

## 2015-02-06 NOTE — Progress Notes (Signed)
Soft with chunks of food noted

## 2015-02-06 NOTE — Discharge Instructions (Signed)
Ad lib activity Diet as tolerated

## 2015-02-06 NOTE — Discharge Summary (Addendum)
Patient ID: Carolyn Adkins MRN: [id number] DOB/AGE: April 26, 1935 80 y.o.  Admit date: 01/27/2015 Discharge date: 02/06/2015  Discharge Diagnoses:  Small Bowel Obstruction  Procedures Performed: Enteroenterostomy  Discharged Condition: stable  Hospital Course: The patient underwent the above mentioned procedure for the above mentioned diagnosis, recovered well, but developed C Diff colitis and was placed on Flagyl for that. At the time of D/C the patient's condition was as listed above.  Discharge Orders: Ad lib activity Diet as tolerated  Discharge Medications:  Current facility-administered medications:  .  acetaminophen (TYLENOL) tablet 650 mg, 650 mg, Oral, Q6H PRN, Doctor Chlconversion, MD, 650 mg at 02/05/15 1614 .  atenolol (TENORMIN) tablet 50 mg, 50 mg, Oral, Daily, Sherri Rad, MD, 50 mg at 02/06/15 0926 .  dextrose 5 % and 0.9 % NaCl with KCl 20 mEq/L infusion, , Intravenous, Continuous, Sherri Rad, MD, Last Rate: 50 mL/hr at 02/06/15 0932 .  enoxaparin (LOVENOX) injection 40 mg, 40 mg, Subcutaneous, Q24H, Doctor Chlconversion, MD, 40 mg at 02/06/15 0925 .  hydrochlorothiazide (HYDRODIURIL) tablet 25 mg, 25 mg, Oral, Daily, Sherri Rad, MD, 25 mg at 02/05/15 2256 .  metroNIDAZOLE (FLAGYL) tablet 500 mg, 500 mg, Oral, 3 times per day, Florene Glen, MD, 500 mg at 02/06/15 2505 .  ondansetron (ZOFRAN) injection 4 mg, 4 mg, Intravenous, Q4H PRN, Doctor Chlconversion, MD .  sodium chloride 0.9 % injection 3-6 mL, 3-6 mL, Intravenous, PRN, Doctor Chlconversion, MD  Follwup:  2 weeks  Signed: Consuela Mimes 02/06/2015, 11:05 AM

## 2015-03-15 DIAGNOSIS — M1712 Unilateral primary osteoarthritis, left knee: Secondary | ICD-10-CM | POA: Insufficient documentation

## 2015-03-15 DIAGNOSIS — M1711 Unilateral primary osteoarthritis, right knee: Secondary | ICD-10-CM | POA: Insufficient documentation

## 2015-08-09 DIAGNOSIS — G5601 Carpal tunnel syndrome, right upper limb: Secondary | ICD-10-CM | POA: Insufficient documentation

## 2015-08-09 DIAGNOSIS — G5602 Carpal tunnel syndrome, left upper limb: Secondary | ICD-10-CM | POA: Insufficient documentation

## 2015-08-09 HISTORY — DX: Carpal tunnel syndrome, left upper limb: G56.02

## 2015-08-09 HISTORY — DX: Carpal tunnel syndrome, right upper limb: G56.01

## 2016-01-01 DIAGNOSIS — M1811 Unilateral primary osteoarthritis of first carpometacarpal joint, right hand: Secondary | ICD-10-CM | POA: Insufficient documentation

## 2016-01-01 HISTORY — DX: Unilateral primary osteoarthritis of first carpometacarpal joint, right hand: M18.11

## 2016-09-09 ENCOUNTER — Ambulatory Visit
Admission: RE | Admit: 2016-09-09 | Discharge: 2016-09-09 | Disposition: A | Discharge status: Home / Self Care | Payer: Medicare Other | Source: Ambulatory Visit | Attending: Internal Medicine | Admitting: Internal Medicine

## 2016-09-09 ENCOUNTER — Other Ambulatory Visit: Payer: Self-pay | Admitting: Internal Medicine

## 2016-09-09 DIAGNOSIS — Z1231 Encounter for screening mammogram for malignant neoplasm of breast: Secondary | ICD-10-CM | POA: Diagnosis present

## 2016-09-09 HISTORY — DX: Malignant (primary) neoplasm, unspecified: C80.1

## 2016-11-07 ENCOUNTER — Other Ambulatory Visit: Payer: Self-pay | Admitting: Internal Medicine

## 2016-11-07 DIAGNOSIS — R17 Unspecified jaundice: Secondary | ICD-10-CM

## 2016-11-07 DIAGNOSIS — R945 Abnormal results of liver function studies: Secondary | ICD-10-CM

## 2016-11-07 DIAGNOSIS — R7989 Other specified abnormal findings of blood chemistry: Secondary | ICD-10-CM

## 2016-11-07 DIAGNOSIS — R748 Abnormal levels of other serum enzymes: Secondary | ICD-10-CM

## 2016-11-08 ENCOUNTER — Emergency Department
Admission: EM | Admit: 2016-11-08 | Discharge: 2016-11-08 | Payer: Medicare Other | Attending: Emergency Medicine | Admitting: Emergency Medicine

## 2016-11-08 ENCOUNTER — Emergency Department: Payer: Medicare Other

## 2016-11-08 ENCOUNTER — Encounter: Payer: Self-pay | Admitting: Emergency Medicine

## 2016-11-08 DIAGNOSIS — R748 Abnormal levels of other serum enzymes: Secondary | ICD-10-CM

## 2016-11-08 DIAGNOSIS — K869 Disease of pancreas, unspecified: Secondary | ICD-10-CM | POA: Diagnosis not present

## 2016-11-08 DIAGNOSIS — I10 Essential (primary) hypertension: Secondary | ICD-10-CM | POA: Insufficient documentation

## 2016-11-08 DIAGNOSIS — R7989 Other specified abnormal findings of blood chemistry: Secondary | ICD-10-CM | POA: Diagnosis present

## 2016-11-08 DIAGNOSIS — Z8541 Personal history of malignant neoplasm of cervix uteri: Secondary | ICD-10-CM | POA: Insufficient documentation

## 2016-11-08 DIAGNOSIS — R17 Unspecified jaundice: Secondary | ICD-10-CM | POA: Diagnosis not present

## 2016-11-08 DIAGNOSIS — Z79899 Other long term (current) drug therapy: Secondary | ICD-10-CM | POA: Insufficient documentation

## 2016-11-08 DIAGNOSIS — K8689 Other specified diseases of pancreas: Secondary | ICD-10-CM

## 2016-11-08 HISTORY — DX: Essential (primary) hypertension: I10

## 2016-11-08 LAB — CBC WITH DIFFERENTIAL/PLATELET
Basophils Absolute: 0 10*3/uL (ref 0–0.1)
Basophils Relative: 0 %
EOS ABS: 0.1 10*3/uL (ref 0–0.7)
Eosinophils Relative: 2 %
HEMATOCRIT: 28.7 % — AB (ref 35.0–47.0)
Hemoglobin: 10.4 g/dL — ABNORMAL LOW (ref 12.0–16.0)
LYMPHS ABS: 0.4 10*3/uL — AB (ref 1.0–3.6)
LYMPHS PCT: 6 %
MCH: 30.8 pg (ref 26.0–34.0)
MCHC: 36.3 g/dL — ABNORMAL HIGH (ref 32.0–36.0)
MCV: 84.7 fL (ref 80.0–100.0)
MONOS PCT: 8 %
Monocytes Absolute: 0.5 10*3/uL (ref 0.2–0.9)
NEUTROS ABS: 5.5 10*3/uL (ref 1.4–6.5)
Neutrophils Relative %: 84 %
PLATELETS: 332 10*3/uL (ref 150–440)
RBC: 3.39 MIL/uL — AB (ref 3.80–5.20)
RDW: 16.2 % — AB (ref 11.5–14.5)
WBC: 6.5 10*3/uL (ref 3.6–11.0)

## 2016-11-08 LAB — URINALYSIS, ROUTINE W REFLEX MICROSCOPIC
GLUCOSE, UA: NEGATIVE mg/dL
Hgb urine dipstick: NEGATIVE
Ketones, ur: NEGATIVE mg/dL
NITRITE: NEGATIVE
PH: 5 (ref 5.0–8.0)
Protein, ur: 30 mg/dL — AB
SPECIFIC GRAVITY, URINE: 1.021 (ref 1.005–1.030)

## 2016-11-08 LAB — MAGNESIUM: MAGNESIUM: 1.5 mg/dL — AB (ref 1.7–2.4)

## 2016-11-08 LAB — COMPREHENSIVE METABOLIC PANEL
ALBUMIN: 3 g/dL — AB (ref 3.5–5.0)
ALK PHOS: 295 U/L — AB (ref 38–126)
ALT: 220 U/L — AB (ref 14–54)
ANION GAP: 10 (ref 5–15)
AST: 215 U/L — AB (ref 15–41)
BILIRUBIN TOTAL: 27.9 mg/dL — AB (ref 0.3–1.2)
BUN: 19 mg/dL (ref 6–20)
CALCIUM: 9.4 mg/dL (ref 8.9–10.3)
CO2: 27 mmol/L (ref 22–32)
Chloride: 96 mmol/L — ABNORMAL LOW (ref 101–111)
Creatinine, Ser: UNDETERMINED mg/dL (ref 0.44–1.00)
Glucose, Bld: 153 mg/dL — ABNORMAL HIGH (ref 65–99)
Potassium: 2.8 mmol/L — ABNORMAL LOW (ref 3.5–5.1)
Sodium: 133 mmol/L — ABNORMAL LOW (ref 135–145)
TOTAL PROTEIN: 6.9 g/dL (ref 6.5–8.1)

## 2016-11-08 LAB — LIPASE, BLOOD: LIPASE: 15 U/L (ref 11–51)

## 2016-11-08 LAB — PROTIME-INR
INR: 1.04
Prothrombin Time: 13.6 seconds (ref 11.4–15.2)

## 2016-11-08 MED ORDER — SODIUM CHLORIDE 0.9 % IV SOLN
30.0000 meq | Freq: Once | INTRAVENOUS | Status: AC
  Administered 2016-11-08: 30 meq via INTRAVENOUS
  Filled 2016-11-08: qty 15

## 2016-11-08 MED ORDER — HYDROXYZINE HCL 10 MG PO TABS: 10.0000 mg | ORAL_TABLET | Freq: Once | ORAL | Status: DC

## 2016-11-08 MED ORDER — SODIUM CHLORIDE 0.9 % IV BOLUS (SEPSIS)
500.0000 mL | Freq: Once | INTRAVENOUS | Status: AC
  Administered 2016-11-08: 500 mL via INTRAVENOUS

## 2016-11-08 MED ORDER — POTASSIUM CHLORIDE CRYS ER 20 MEQ PO TBCR
40.0000 meq | EXTENDED_RELEASE_TABLET | Freq: Once | ORAL | Status: AC
  Administered 2016-11-08: 40 meq via ORAL
  Filled 2016-11-08: qty 2

## 2016-11-08 NOTE — ED Triage Notes (Signed)
Daughter noticed sclera looking icteric this am, patient has had itching x 2 weeks, decreased appetite, no pain.

## 2016-11-08 NOTE — ED Provider Notes (Addendum)
[facility] Emergency Department Provider Note  ____________________________________________   I have reviewed the triage vital signs and the nursing notes.   HISTORY  Chief Complaint Jaundice    HPI Carolyn Adkins is a 82 y.o. female who presents today after having itching for several weeks. She has been evaluated by primary care doctor for this. They found elevated liver function tests on her and suspect that is the cause of her pruritus and sent her in. Patient states she has had diarrhea only this morning. She has, unfortunately, had a 30 pound a partial weight loss over the last several months. This was unintentional. She denies abdominal pain or vomiting. No known gallbladder or biliary problems. The patient has never had any diagnosis that would give a cause for this that she knows of. She only complains of itching. She does have dark urine. Denies dysuria or urinary frequency.     Past Medical History:  Diagnosis Date  . Cancer ([place])    uterine ca  . Hypertension     Patient Active Problem List   Diagnosis Date Noted  . Small bowel obstruction 02/03/2015    Past Surgical History:  Procedure Laterality Date  . ABDOMINAL HYSTERECTOMY      Prior to Admission medications   Medication Sig Start Date End Date Taking? Authorizing Provider  atenolol-chlorthalidone (TENORETIC) 50-25 MG per tablet Take 1 tablet by mouth daily.    Historical Provider, MD  diclofenac (VOLTAREN) 75 MG EC tablet Take 75 mg by mouth 2 (two) times daily.    Historical Provider, MD  Multiple Vitamin (MULTIVITAMIN) tablet Take 1 tablet by mouth daily.    Historical Provider, MD  Vitamin D, Cholecalciferol, 400 UNITS CAPS Take 1 capsule by mouth daily.    Historical Provider, MD    Allergies Patient has no known allergies.  Family History  Problem Relation Age of Onset  . Breast cancer Mother     Social History Social History  Substance Use Topics  . Smoking  status: Never Smoker  . Smokeless tobacco: Not on file  . Alcohol use Not on file    Review of Systems Constitutional: No fever/chills Eyes: No visual changes. ENT: No sore throat. No stiff neck no neck pain Cardiovascular: Denies chest pain. Respiratory: Denies shortness of breath. Gastrointestinal:   no vomiting.  See history of present illness diarrhea.  No constipation. Genitourinary: Negative for dysuria. Musculoskeletal: Negative lower extremity swelling Skin: Negative for rash. Neurological: Negative for severe headaches, focal weakness or numbness. 10-point ROS otherwise negative.  ____________________________________________   PHYSICAL EXAM:  VITAL SIGNS: ED Triage Vitals  Enc Vitals Group     BP 11/08/16 1133 112/80     Pulse Rate 11/08/16 1133 94     Resp 11/08/16 1133 20     Temp 11/08/16 1133 97.6 F (36.4 C)     Temp Source 11/08/16 1133 Oral     SpO2 11/08/16 1133 100 %     Weight 11/08/16 1134 152 lb (68.9 kg)     Height 11/08/16 1134 5\' 4"  (1.626 m)     Head Circumference --      Peak Flow --      Pain Score 11/08/16 1134 0     Pain Loc --      Pain Edu? --      Excl. in [place]? --     Constitutional: Alert and oriented. Well appearing and in no acute distress. Eyes: Conjunctivae are Significant way icteric. PERRL. EOMI.  Head: Atraumatic. Nose: No congestion/rhinnorhea. Mouth/Throat: Mucous membranes are moist.  Oropharynx non-erythematous. Neck: No stridor.   Nontender with no meningismus Cardiovascular: Normal rate, regular rhythm. Grossly normal heart sounds.  Good peripheral circulation. Respiratory: Normal respiratory effort.  No retractions. Lungs CTAB. Abdominal: Soft and nontender. No distention. No guarding no rebound Back:  There is no focal tenderness or step off.  there is no midline tenderness there are no lesions noted. there is no CVA tenderness Musculoskeletal: No lower extremity tenderness, no upper extremity tenderness. No joint  effusions, no DVT signs strong distal pulses no edema Neurologic:  Normal speech and language. No gross focal neurologic deficits are appreciated.  Skin:  Skin is warm, dry and intact. No rash noted. Psychiatric: Mood and affect are normal. Speech and behavior are normal.  ____________________________________________   LABS (all labs ordered are listed, but only abnormal results are displayed)  Labs Reviewed  CBC WITH DIFFERENTIAL/PLATELET - Abnormal; Notable for the following:       Result Value   RBC 3.39 (*)    Hemoglobin 10.4 (*)    HCT 28.7 (*)    MCHC 36.3 (*)    RDW 16.2 (*)    Lymphs Abs 0.4 (*)    All other components within normal limits  COMPREHENSIVE METABOLIC PANEL - Abnormal; Notable for the following:    Sodium 133 (*)    Potassium 2.8 (*)    Chloride 96 (*)    Glucose, Bld 153 (*)    Albumin 3.0 (*)    AST 215 (*)    ALT 220 (*)    Alkaline Phosphatase 295 (*)    Total Bilirubin 27.9 (*)    All other components within normal limits  URINALYSIS, ROUTINE W REFLEX MICROSCOPIC - Abnormal; Notable for the following:    Color, Urine AMBER (*)    APPearance CLEAR (*)    Bilirubin Urine MODERATE (*)    Protein, ur 30 (*)    Leukocytes, UA SMALL (*)    Bacteria, UA RARE (*)    Squamous Epithelial / LPF 0-5 (*)    Non Squamous Epithelial 0-5 (*)    All other components within normal limits  LIPASE, BLOOD  PROTIME-INR   ____________________________________________  EKG  I personally interpreted any EKGs ordered by me or triage  ____________________________________________  RADIOLOGY  I reviewed any imaging ordered by me or triage that were performed during my shift and, if possible, patient and/or family made aware of any abnormal findings. ____________________________________________   PROCEDURES  Procedure(s) performed: None  Procedures  Critical Care performed: None  ____________________________________________   INITIAL IMPRESSION /  ASSESSMENT AND PLAN / ED COURSE  Pertinent labs & imaging results that were available during my care of the patient were reviewed by me and considered in my medical decision making (see chart for details).  Patient with significant jaundice. Very high bilirubin consistent with a possible obstructive process in her biliary tree. They cannot result a creatinine for her so we will start with an ultrasound to evaluate for possible mass. There is negative she'll need CT. She will need admission. Potassium is 2.8. We are giving her potassium. She is not otherwise acutely ill appearing.  ----------------------------------------- 3:50 PM on 11/08/2016 -----------------------------------------  Signed out to dr. Archie Balboa at the end of my shift.    ____________________________________________   FINAL CLINICAL IMPRESSION(S) / ED DIAGNOSES  Final diagnoses:  Jaundice      This chart was dictated using voice recognition software.  Despite best  efforts to proofread,  errors can occur which can change meaning.      Schuyler Amor, MD 11/08/16 [place], MD 11/08/16 [phone]

## 2016-11-08 NOTE — ED Notes (Signed)
Pt escorted to bathroom. Family at bedside. Updated on plan of care.

## 2016-11-08 NOTE — ED Notes (Signed)
Report given to daniel in [facility] ED and carelink.

## 2016-11-08 NOTE — ED Provider Notes (Signed)
Patient's ultrasound showed a pancreatic mass. This is concerning for pancreatic cancer and is likely the cause of the patient's elevated LFTs. Given that we do not have ERCP capabilities in this hospital over the weekends patient was arranged to be transferred to [place], MD 11/08/16 1806

## 2016-11-09 DIAGNOSIS — I10 Essential (primary) hypertension: Secondary | ICD-10-CM

## 2016-11-09 DIAGNOSIS — K8689 Other specified diseases of pancreas: Secondary | ICD-10-CM

## 2016-11-09 DIAGNOSIS — M1990 Unspecified osteoarthritis, unspecified site: Secondary | ICD-10-CM

## 2016-11-09 HISTORY — DX: Essential (primary) hypertension: I10

## 2016-11-09 HISTORY — DX: Unspecified osteoarthritis, unspecified site: M19.90

## 2016-11-09 HISTORY — DX: Other specified diseases of pancreas: K86.89

## 2016-11-10 ENCOUNTER — Other Ambulatory Visit: Payer: Self-pay

## 2016-11-10 ENCOUNTER — Ambulatory Visit: Payer: Medicare Other | Attending: Internal Medicine

## 2016-11-10 ENCOUNTER — Ambulatory Visit: Payer: Self-pay | Admitting: Gastroenterology

## 2016-11-10 DIAGNOSIS — M1710 Unilateral primary osteoarthritis, unspecified knee: Secondary | ICD-10-CM | POA: Insufficient documentation

## 2016-11-10 DIAGNOSIS — K219 Gastro-esophageal reflux disease without esophagitis: Secondary | ICD-10-CM | POA: Insufficient documentation

## 2016-11-10 DIAGNOSIS — D649 Anemia, unspecified: Secondary | ICD-10-CM

## 2016-11-10 DIAGNOSIS — E559 Vitamin D deficiency, unspecified: Secondary | ICD-10-CM

## 2016-11-10 DIAGNOSIS — M8580 Other specified disorders of bone density and structure, unspecified site: Secondary | ICD-10-CM

## 2016-11-10 DIAGNOSIS — E7800 Pure hypercholesterolemia, unspecified: Secondary | ICD-10-CM

## 2016-11-10 DIAGNOSIS — M179 Osteoarthritis of knee, unspecified: Secondary | ICD-10-CM | POA: Insufficient documentation

## 2016-11-10 HISTORY — DX: Other specified disorders of bone density and structure, unspecified site: M85.80

## 2016-11-10 HISTORY — DX: Vitamin D deficiency, unspecified: E55.9

## 2016-11-10 HISTORY — DX: Anemia, unspecified: D64.9

## 2016-11-10 HISTORY — DX: Pure hypercholesterolemia, unspecified: E78.00

## 2016-11-10 HISTORY — DX: Osteoarthritis of knee, unspecified: M17.9

## 2016-11-10 HISTORY — DX: Unilateral primary osteoarthritis, unspecified knee: M17.10

## 2016-12-02 ENCOUNTER — Inpatient Hospital Stay: Payer: Medicare Other

## 2016-12-02 ENCOUNTER — Encounter: Payer: Self-pay | Admitting: Internal Medicine

## 2016-12-02 ENCOUNTER — Inpatient Hospital Stay: Payer: Medicare Other | Attending: Internal Medicine | Admitting: Internal Medicine

## 2016-12-02 DIAGNOSIS — I10 Essential (primary) hypertension: Secondary | ICD-10-CM | POA: Diagnosis not present

## 2016-12-02 DIAGNOSIS — G5600 Carpal tunnel syndrome, unspecified upper limb: Secondary | ICD-10-CM | POA: Diagnosis not present

## 2016-12-02 DIAGNOSIS — R5383 Other fatigue: Secondary | ICD-10-CM | POA: Insufficient documentation

## 2016-12-02 DIAGNOSIS — E559 Vitamin D deficiency, unspecified: Secondary | ICD-10-CM | POA: Diagnosis not present

## 2016-12-02 DIAGNOSIS — K56609 Unspecified intestinal obstruction, unspecified as to partial versus complete obstruction: Secondary | ICD-10-CM | POA: Insufficient documentation

## 2016-12-02 DIAGNOSIS — Z7982 Long term (current) use of aspirin: Secondary | ICD-10-CM | POA: Diagnosis not present

## 2016-12-02 DIAGNOSIS — E7800 Pure hypercholesterolemia, unspecified: Secondary | ICD-10-CM | POA: Insufficient documentation

## 2016-12-02 DIAGNOSIS — F32A Depression, unspecified: Secondary | ICD-10-CM

## 2016-12-02 DIAGNOSIS — E119 Type 2 diabetes mellitus without complications: Secondary | ICD-10-CM | POA: Diagnosis not present

## 2016-12-02 DIAGNOSIS — C250 Malignant neoplasm of head of pancreas: Secondary | ICD-10-CM | POA: Diagnosis not present

## 2016-12-02 DIAGNOSIS — M8580 Other specified disorders of bone density and structure, unspecified site: Secondary | ICD-10-CM | POA: Insufficient documentation

## 2016-12-02 DIAGNOSIS — F329 Major depressive disorder, single episode, unspecified: Secondary | ICD-10-CM

## 2016-12-02 DIAGNOSIS — R600 Localized edema: Secondary | ICD-10-CM | POA: Insufficient documentation

## 2016-12-02 DIAGNOSIS — Z8542 Personal history of malignant neoplasm of other parts of uterus: Secondary | ICD-10-CM | POA: Insufficient documentation

## 2016-12-02 DIAGNOSIS — Z79899 Other long term (current) drug therapy: Secondary | ICD-10-CM | POA: Insufficient documentation

## 2016-12-02 LAB — CBC WITH DIFFERENTIAL/PLATELET
BASOS ABS: 0 10*3/uL (ref 0–0.1)
Basophils Relative: 1 %
Eosinophils Absolute: 0.3 10*3/uL (ref 0–0.7)
Eosinophils Relative: 6 %
HEMATOCRIT: 27.3 % — AB (ref 35.0–47.0)
HEMOGLOBIN: 9.1 g/dL — AB (ref 12.0–16.0)
LYMPHS ABS: 1 10*3/uL (ref 1.0–3.6)
LYMPHS PCT: 19 %
MCH: 28.7 pg (ref 26.0–34.0)
MCHC: 33.2 g/dL (ref 32.0–36.0)
MCV: 86.4 fL (ref 80.0–100.0)
Monocytes Absolute: 0.5 10*3/uL (ref 0.2–0.9)
Monocytes Relative: 11 %
NEUTROS ABS: 3.2 10*3/uL (ref 1.4–6.5)
NEUTROS PCT: 63 %
PLATELETS: 298 10*3/uL (ref 150–440)
RBC: 3.16 MIL/uL — AB (ref 3.80–5.20)
RDW: 16.4 % — ABNORMAL HIGH (ref 11.5–14.5)
WBC: 5.1 10*3/uL (ref 3.6–11.0)

## 2016-12-02 LAB — COMPREHENSIVE METABOLIC PANEL
ALK PHOS: 98 U/L (ref 38–126)
ALT: 25 U/L (ref 14–54)
AST: 35 U/L (ref 15–41)
Albumin: 2.9 g/dL — ABNORMAL LOW (ref 3.5–5.0)
Anion gap: 11 (ref 5–15)
BILIRUBIN TOTAL: 5 mg/dL — AB (ref 0.3–1.2)
BUN: 11 mg/dL (ref 6–20)
CALCIUM: 9.4 mg/dL (ref 8.9–10.3)
CHLORIDE: 103 mmol/L (ref 101–111)
CO2: 24 mmol/L (ref 22–32)
CREATININE: 0.82 mg/dL (ref 0.44–1.00)
Glucose, Bld: 107 mg/dL — ABNORMAL HIGH (ref 65–99)
Potassium: 3.9 mmol/L (ref 3.5–5.1)
Sodium: 138 mmol/L (ref 135–145)
Total Protein: 7 g/dL (ref 6.5–8.1)

## 2016-12-02 NOTE — Progress Notes (Signed)
[facility] CONSULT NOTE  Patient Care Team: Ezequiel Kayser, MD as PCP - General (Internal Medicine)  CHIEF COMPLAINTS/PURPOSE OF CONSULTATION:  Pancreatic cancer  #  Oncology History   # FEB 2018- PANCREAS HEAD [place] ca [mass encasing SMV, splenic Vein, proximal portal Vein];  EUS Bx-fibrous tissue suspicious for invasive adeno ca [Feb 12th 2018- 5 x 5cm; 3 peripancreatic LN- malignant appearing; no lesions in liver]; # Ca- 19-9 [>100K]   # Obstructive jaundice [bilirubin 23 Feb 2nd 2018-s/p ERCP with stenting; brushing- inconclusive; [facility] feb 5th 2018]   # Uterine cancer ? Stage; s/p TAH & BSO [[facility] ? 2-3 years ago]       Malignant neoplasm of head of pancreas (HCC)     HISTORY OF PRESENTING ILLNESS:  Carolyn Adkins 82 y.o.  female recently diagnosed with pancreatic cancer is here for her initial consultation.  Patient had presented to the emergency room with elevated bilirubin-27; symptomatic itching of the skin in early February 2018. Given the obstructive jaundice patient was transferred to [facility] that she had ERCP with stenting. She went on to have an endoscopic ultrasound and biopsy- biopsy suspicious for invasive adenocarcinoma. CA-19-9 greater than 100,000.  Patient is a poor appetite. Positive for weight loss. Positive for bilateral extremity. No cramping. Complaints of pain in the back.  Patient has a prior history of Uterine cancer ca- [facility]; no chemo RT [3-4 years ago s/p TAH & BSO].  Denies any history of heart problems. Or diabetes. No neuropathy.  ROS: A complete 10 point review of system is done which is negative except mentioned above in history of present illness  MEDICAL HISTORY:  Past Medical History:  Diagnosis Date  . Anemia 11/10/2016  . Arthritis 11/09/2016  . Cancer ([place])    uterine ca  . Carpal tunnel syndrome on right 08/09/2015  . Carpal tunnel syndrome, left 08/09/2015  . Essential hypertension 11/09/2016   . Hypercholesterolemia 11/10/2016  . Hypertension   . OA (osteoarthritis) of knee 11/10/2016  . Osteopenia 11/10/2016  . Pancreatic mass 11/09/2016  . Primary osteoarthritis of first carpometacarpal joint of right hand 01/01/2016  . Radiculopathy of lumbosacral region 09/12/2014  . Small bowel obstruction 02/03/2015  . Type 2 diabetes mellitus without complication, without long-term current use of insulin ([place]) 02/27/2014   Overview:  HgbA1c reached 6.5% 09/09/2016  . Vitamin D deficiency 11/10/2016    SURGICAL HISTORY: Past Surgical History:  Procedure Laterality Date  . ABDOMINAL HYSTERECTOMY      SOCIAL HISTORY: Social History   Social History  . Marital status: Single    Spouse name: N/A  . Number of children: N/A  . Years of education: N/A   Occupational History  . Not on file.   Social History Main Topics  . Smoking status: Never Smoker  . Smokeless tobacco: Not on file  . Alcohol use Not on file  . Drug use: Unknown  . Sexual activity: Not on file   Other Topics Concern  . Not on file   Social History Narrative  . No narrative on file    FAMILY HISTORY: mother- breast cancer- 79s [died at 60]; sister [? Cancer- 26s]; sister [uterus- 55s]; 7 girls; brother  One died on stomach aneursym.  Family History  Problem Relation Age of Onset  . Breast cancer Mother   . Cancer Sister   . Cancer Sister     uterine    ALLERGIES:  has No Known Allergies.  MEDICATIONS:  Current Outpatient Prescriptions  Medication Sig Dispense Refill  . acetaminophen (TYLENOL) 500 MG tablet Take by mouth.    Marland Kitchen aspirin EC 81 MG tablet Take by mouth.    . camphor-menthol (SARNA) lotion Apply topically.    . Cholecalciferol (VITAMIN D3) 2000 units capsule Take 2,000 Units by mouth daily.    . cholestyramine (QUESTRAN) 4 g packet Take 4 g by mouth 2 (two) times daily.    Marland Kitchen docusate sodium (COLACE) 100 MG capsule Take 100 mg by mouth daily as needed.    . doxepin (SINEQUAN) 10 MG capsule Take  10 mg by mouth. Take 2-3 tablets (20-30 mg) by mouth nightly as needed for itching    . fluticasone (FLONASE) 50 MCG/ACT nasal spray Place into the nose.    . hydrOXYzine (ATARAX/VISTARIL) 25 MG tablet Take 25 mg by mouth.    . Multiple Vitamin (MULTIVITAMIN) tablet Take 1 tablet by mouth daily.    . Omega-3 Fatty Acids (FISH OIL) 1000 MG CAPS Take by mouth.    . ondansetron (ZOFRAN-ODT) 4 MG disintegrating tablet Take 4 mg by mouth.     No current facility-administered medications for this visit.       Marland Kitchen  PHYSICAL EXAMINATION: ECOG PERFORMANCE STATUS: 2 - Symptomatic, <50% confined to bed  Vitals:   12/02/16 1525  BP: 113/70  Pulse: (!) 102  Temp: 98.8 F (37.1 C)   Filed Weights   12/02/16 1525  Weight: 166 lb 14.2 oz (75.7 kg)    GENERAL: Well-nourished well-developed; Alert, no distress and comfortable.   Accompanied by her sister. She is in a wheelchair. EYES: no pallor or icterus OROPHARYNX: no thrush or ulceration; good dentition  NECK: supple, no masses felt LYMPH:  no palpable lymphadenopathy in the cervical, axillary or inguinal regions LUNGS: clear to auscultation and  No wheeze or crackles HEART/CVS: regular rate & rhythm and no murmurs; Bil LE lower extremity edema ABDOMEN: abdomen soft, non-tender and normal bowel sounds Musculoskeletal:no cyanosis of digits and no clubbing  PSYCH: alert & oriented x 3 with fluent speech NEURO: no focal motor/sensory deficits SKIN:  no rashes or significant lesions  LABORATORY DATA:  I have reviewed the data as listed Lab Results  Component Value Date   WBC 6.5 11/08/2016   HGB 10.4 (L) 11/08/2016   HCT 28.7 (L) 11/08/2016   MCV 84.7 11/08/2016   PLT 332 11/08/2016    Recent Labs  11/08/16 1151  NA 133*  K 2.8*  CL 96*  CO2 27  GLUCOSE 153*  BUN 19  CREATININE UNABLE TO REPORT DUE TO ICTERUS  CALCIUM 9.4  GFRNONAA NOT CALCULATED  GFRAA NOT CALCULATED  PROT 6.9  ALBUMIN 3.0*  AST 215*  ALT 220*   ALKPHOS 295*  BILITOT 27.9*    RADIOGRAPHIC STUDIES: I have personally reviewed the radiological images as listed and agreed with the findings in the report. US Abdomen Limited Ruq  Result Date: 11/08/2016 CLINICAL DATA:  Jaundice. EXAM: US ABDOMEN LIMITED - RIGHT UPPER QUADRANT COMPARISON:  CT abdomen dated 01/27/2015. FINDINGS: Gallbladder: Not seen (contracted? ). Common bile duct: Diameter: Markedly dilated, measuring approximately 16-17 mm diameter. This is new dilatation compared to the earlier CT of 01/27/2015. No obstructing stone identified. Suspect obstructing mass within the pancreatic head Liver: No focal lesion identified. Within normal limits in parenchymal echogenicity. Intrahepatic bile duct dilatation. IMPRESSION: Marked CBD and intrahepatic bile duct dilatation, new compared to a CT of 01/27/2015. The pancreatic head has an  abnormal rounded/bulbous appearance, suspected neoplastic mass in the pancreatic head causing the bile duct dilatation. Recommend further characterization with MRCP or ERCP. Gallbladder not seen (contracted? ). Electronically Signed   By: Franki Cabot M.D.   On: 11/08/2016 16:14    ASSESSMENT & PLAN:   Malignant neoplasm of head of pancreas [facility]) # Pancreatic adenocarcinoma- abutting the vasculature/ peripancreatic lymph nodes. At least locally advanced base on the available imaging. Recommend CBC CMP PET scan as soon as possible. Check CA-19-9.  # Recommend gemcitabine-Abraxane chemotherapy; discussed response rates are 20%. Although waiting on the imaging- I suspect patient is not a surgical candidate given her age/performance status/locally advanced disease. However, they wanted to be referred to surgical oncology.   # Discussed the potential side effects including but not limited to-increasing fatigue, nausea vomiting, diarrhea, hair loss, sores in the mouth, increase risk of infection and also neuropathy.   # Discussed regarding port; interested.  Chemotherapy education.  # Patient will be candidate for genetic testing given multiple malignancies-personal/ family history.   # Follow-up in approximately 1 week to review the imaging/plan of care; chemotherapy next day. Also patient lives in [place] with her sister. I recommend her checking into [facility].  Thank you Dr.Theis for allowing me to participate in the care of your pleasant patient. Please do not hesitate to contact me with questions or concerns in the interim. Discussed with Dr.Theis my above impression/plan.   # 60 minutes face-to-face with the patient discussing the above plan of care; more than 50% of time spent on prognosis/ natural history; counseling and coordination.   All questions were answered. The patient knows to call the clinic with any problems, questions or concerns. Reviewed the records from [facility] extensively; summarized above.     Cammie Sickle, MD 12/02/2016 4:29 PM

## 2016-12-02 NOTE — Progress Notes (Signed)
START ON PATHWAY REGIMEN - Pancreatic     A cycle is every 28 days:     Nab-paclitaxel (protein bound)        Dose Mod: None     Gemcitabine        Dose Mod: None  **Always confirm dose/schedule in your pharmacy ordering system**    Patient Characteristics: Adenocarcinoma, Locally Advanced, Anatomically Unresectable, First Line, PS >= 2 Current evidence of distant metastases? No AJCC T Stage: X AJCC N Stage: X AJCC Stage Grouping: III AJCC M Stage: X Histology: Adenocarcinoma Line of therapy: First Line Would you be surprised if this patient died  in the next year? I would be surprised if this patient died in the next year  Intent of Therapy: Non-Curative / Palliative Intent, Discussed with Patient

## 2016-12-02 NOTE — Progress Notes (Signed)
Patient here today for new consult.  Patient c/o left sided back pain

## 2016-12-02 NOTE — Assessment & Plan Note (Addendum)
#   Pancreatic adenocarcinoma- abutting the vasculature/ peripancreatic lymph nodes. At least locally advanced base on the available imaging. Recommend CBC CMP PET scan as soon as possible. Check CA-19-9.  # Recommend gemcitabine-Abraxane chemotherapy; discussed response rates are 20%. Although waiting on the imaging- I suspect patient is not a surgical candidate given her age/performance status/locally advanced disease. However, they wanted to be referred to surgical oncology.   # Discussed the potential side effects including but not limited to-increasing fatigue, nausea vomiting, diarrhea, hair loss, sores in the mouth, increase risk of infection and also neuropathy.   # Discussed regarding port; interested. Chemotherapy education.  # Patient will be candidate for genetic testing given multiple malignancies-personal/ family history.   # Follow-up in approximately 1 week to review the imaging/plan of care; chemotherapy next day. Also patient lives in [place] with her sister. I recommend her checking into [facility].  Thank you Dr.Theis for allowing me to participate in the care of your pleasant patient. Please do not hesitate to contact me with questions or concerns in the interim. Discussed with Dr.Theis my above impression/plan.   # 60 minutes face-to-face with the patient discussing the above plan of care; more than 50% of time spent on prognosis/ natural history; counseling and coordination.

## 2016-12-03 ENCOUNTER — Other Ambulatory Visit (INDEPENDENT_AMBULATORY_CARE_PROVIDER_SITE_OTHER): Payer: Self-pay

## 2016-12-03 ENCOUNTER — Encounter (INDEPENDENT_AMBULATORY_CARE_PROVIDER_SITE_OTHER): Payer: Self-pay

## 2016-12-03 LAB — THYROID PANEL WITH TSH
FREE THYROXINE INDEX: 2 (ref 1.2–4.9)
T3 UPTAKE RATIO: 25 % (ref 24–39)
T4, Total: 8 ug/dL (ref 4.5–12.0)
TSH: 1.25 u[IU]/mL (ref 0.450–4.500)

## 2016-12-03 LAB — CANCER ANTIGEN 19-9: CA 19 9: 940 U/mL — AB (ref 0–35)

## 2016-12-04 ENCOUNTER — Encounter
Admission: RE | Admit: 2016-12-04 | Discharge: 2016-12-04 | Disposition: A | Discharge status: Home / Self Care | Payer: Medicare Other | Source: Ambulatory Visit | Attending: Internal Medicine | Admitting: Internal Medicine

## 2016-12-04 ENCOUNTER — Ambulatory Visit
Admission: RE | Admit: 2016-12-04 | Discharge: 2016-12-04 | Disposition: A | Discharge status: Home / Self Care | Payer: Medicare Other | Source: Ambulatory Visit | Attending: Internal Medicine | Admitting: Internal Medicine

## 2016-12-04 ENCOUNTER — Other Ambulatory Visit: Payer: Self-pay | Admitting: *Deleted

## 2016-12-04 ENCOUNTER — Other Ambulatory Visit: Payer: Medicare Other

## 2016-12-04 DIAGNOSIS — M47812 Spondylosis without myelopathy or radiculopathy, cervical region: Secondary | ICD-10-CM | POA: Diagnosis not present

## 2016-12-04 DIAGNOSIS — C250 Malignant neoplasm of head of pancreas: Secondary | ICD-10-CM

## 2016-12-04 DIAGNOSIS — R188 Other ascites: Secondary | ICD-10-CM | POA: Insufficient documentation

## 2016-12-04 DIAGNOSIS — M7989 Other specified soft tissue disorders: Secondary | ICD-10-CM | POA: Diagnosis present

## 2016-12-04 DIAGNOSIS — M5030 Other cervical disc degeneration, unspecified cervical region: Secondary | ICD-10-CM | POA: Diagnosis not present

## 2016-12-04 DIAGNOSIS — R600 Localized edema: Secondary | ICD-10-CM

## 2016-12-04 DIAGNOSIS — Z452 Encounter for adjustment and management of vascular access device: Secondary | ICD-10-CM

## 2016-12-04 LAB — GLUCOSE, CAPILLARY: GLUCOSE-CAPILLARY: 88 mg/dL (ref 65–99)

## 2016-12-04 MED ORDER — FLUDEOXYGLUCOSE F - 18 (FDG) INJECTION
12.0000 | Freq: Once | INTRAVENOUS | Status: AC | PRN
  Administered 2016-12-04: 12.41 via INTRAVENOUS

## 2016-12-04 NOTE — Patient Instructions (Signed)
Nanoparticle Albumin-Bound Paclitaxel injection What is this medicine? NANOPARTICLE ALBUMIN-BOUND PACLITAXEL (Na no PAHR ti kuhl al BYOO muhn-bound PAK li TAX el) is a chemotherapy drug. It targets fast dividing cells, like cancer cells, and causes these cells to die. This medicine is used to treat advanced breast cancer and advanced lung cancer. This medicine may be used for other purposes; ask your health care provider or pharmacist if you have questions. COMMON BRAND NAME(S): Abraxane What should I tell my health care provider before I take this medicine? They need to know if you have any of these conditions: -kidney disease -liver disease -low blood counts, like low platelets, red blood cells, or white blood cells -recent or ongoing radiation therapy -an unusual or allergic reaction to paclitaxel, albumin, other chemotherapy, other medicines, foods, dyes, or preservatives -pregnant or trying to get pregnant -breast-feeding How should I use this medicine? This drug is given as an infusion into a vein. It is administered in a hospital or clinic by a specially trained health care professional. Talk to your pediatrician regarding the use of this medicine in children. Special care may be needed. Overdosage: If you think you have taken too much of this medicine contact a poison control center or emergency room at once. NOTE: This medicine is only for you. Do not share this medicine with others. What if I miss a dose? It is important not to miss your dose. Call your doctor or health care professional if you are unable to keep an appointment. What may interact with this medicine? -cyclosporine -diazepam -ketoconazole -medicines to increase blood counts like filgrastim, pegfilgrastim, sargramostim -other chemotherapy drugs like cisplatin, doxorubicin, epirubicin, etoposide, teniposide, vincristine -quinidine -testosterone -vaccines -verapamil Talk to your doctor or health care professional  before taking any of these medicines: -acetaminophen -aspirin -ibuprofen -ketoprofen -naproxen This list may not describe all possible interactions. Give your health care provider a list of all the medicines, herbs, non-prescription drugs, or dietary supplements you use. Also tell them if you smoke, drink alcohol, or use illegal drugs. Some items may interact with your medicine. What should I watch for while using this medicine? Your condition will be monitored carefully while you are receiving this medicine. You will need important blood work done while you are taking this medicine. This medicine can cause serious allergic reactions. If you experience allergic reactions like skin rash, itching or hives, swelling of the face, lips, or tongue, tell your doctor or health care professional right away. In some cases, you may be given additional medicines to help with side effects. Follow all directions for their use. This drug may make you feel generally unwell. This is not uncommon, as chemotherapy can affect healthy cells as well as cancer cells. Report any side effects. Continue your course of treatment even though you feel ill unless your doctor tells you to stop. Call your doctor or health care professional for advice if you get a fever, chills or sore throat, or other symptoms of a cold or flu. Do not treat yourself. This drug decreases your body's ability to fight infections. Try to avoid being around people who are sick. This medicine may increase your risk to bruise or bleed. Call your doctor or health care professional if you notice any unusual bleeding. Be careful brushing and flossing your teeth or using a toothpick because you may get an infection or bleed more easily. If you have any dental work done, tell your dentist you are receiving this medicine. Avoid taking products that  notice any unusual bleeding.  Be careful brushing and flossing your teeth or using a toothpick because you may get an infection or bleed more easily. If you have any dental work done, tell your dentist you are receiving this medicine.  Avoid taking products that contain aspirin, acetaminophen, ibuprofen, naproxen, or ketoprofen unless instructed by your doctor. These  medicines may hide a fever.  Do not become pregnant while taking this medicine. Women should inform their doctor if they wish to become pregnant or think they might be pregnant. There is a potential for serious side effects to an unborn child. Talk to your health care professional or pharmacist for more information. Do not breast-feed an infant while taking this medicine.  Men are advised not to father a child while receiving this medicine.  What side effects may I notice from receiving this medicine?  Side effects that you should report to your doctor or health care professional as soon as possible:  -allergic reactions like skin rash, itching or hives, swelling of the face, lips, or tongue  -low blood counts - This drug may decrease the number of white blood cells, red blood cells and platelets. You may be at increased risk for infections and bleeding.  -signs of infection - fever or chills, cough, sore throat, pain or difficulty passing urine  -signs of decreased platelets or bleeding - bruising, pinpoint red spots on the skin, black, tarry stools, nosebleeds  -signs of decreased red blood cells - unusually weak or tired, fainting spells, lightheadedness  -breathing problems  -changes in vision  -chest pain  -high or low blood pressure  -mouth sores  -nausea and vomiting  -pain, swelling, redness or irritation at the injection site  -pain, tingling, numbness in the hands or feet  -slow or irregular heartbeat  -swelling of the ankle, feet, hands  Side effects that usually do not require medical attention (report to your doctor or health care professional if they continue or are bothersome):  -aches, pains  -changes in the color of fingernails  -diarrhea  -hair loss  -loss of appetite  This list may not describe all possible side effects. Call your doctor for medical advice about side effects. You may report side effects to FDA at 1-800-FDA-1088.  Where should I keep my medicine?  This drug is given in a hospital  or clinic and will not be stored at home.  NOTE: This sheet is a summary. It may not cover all possible information. If you have questions about this medicine, talk to your doctor, pharmacist, or health care provider.   2018 Elsevier/Gold Standard (2015-07-25 10:05:20)  Gemcitabine injection  What is this medicine?  GEMCITABINE (jem SIT a been) is a chemotherapy drug. This medicine is used to treat many types of cancer like breast cancer, lung cancer, pancreatic cancer, and ovarian cancer.  This medicine may be used for other purposes; ask your health care provider or pharmacist if you have questions.  COMMON BRAND NAME(S): Gemzar  What should I tell my health care provider before I take this medicine?  They need to know if you have any of these conditions:  -blood disorders  -infection  -kidney disease  -liver disease  -recent or ongoing radiation therapy  -an unusual or allergic reaction to gemcitabine, other chemotherapy, other medicines, foods, dyes, or preservatives  -pregnant or trying to get pregnant  -breast-feeding  How should I use this medicine?  This drug is given as an infusion into a vein. It is administered   in a hospital or clinic by a specially trained health care professional.  Talk to your pediatrician regarding the use of this medicine in children. Special care may be needed.  Overdosage: If you think you have taken too much of this medicine contact a poison control center or emergency room at once.  NOTE: This medicine is only for you. Do not share this medicine with others.  What if I miss a dose?  It is important not to miss your dose. Call your doctor or health care professional if you are unable to keep an appointment.  What may interact with this medicine?  -medicines to increase blood counts like filgrastim, pegfilgrastim, sargramostim  -some other chemotherapy drugs like cisplatin  -vaccines  Talk to your doctor or health care professional before taking any of these  medicines:  -acetaminophen  -aspirin  -ibuprofen  -ketoprofen  -naproxen  This list may not describe all possible interactions. Give your health care provider a list of all the medicines, herbs, non-prescription drugs, or dietary supplements you use. Also tell them if you smoke, drink alcohol, or use illegal drugs. Some items may interact with your medicine.  What should I watch for while using this medicine?  Visit your doctor for checks on your progress. This drug may make you feel generally unwell. This is not uncommon, as chemotherapy can affect healthy cells as well as cancer cells. Report any side effects. Continue your course of treatment even though you feel ill unless your doctor tells you to stop.  In some cases, you may be given additional medicines to help with side effects. Follow all directions for their use.  Call your doctor or health care professional for advice if you get a fever, chills or sore throat, or other symptoms of a cold or flu. Do not treat yourself. This drug decreases your body's ability to fight infections. Try to avoid being around people who are sick.  This medicine may increase your risk to bruise or bleed. Call your doctor or health care professional if you notice any unusual bleeding.  Be careful brushing and flossing your teeth or using a toothpick because you may get an infection or bleed more easily. If you have any dental work done, tell your dentist you are receiving this medicine.  Avoid taking products that contain aspirin, acetaminophen, ibuprofen, naproxen, or ketoprofen unless instructed by your doctor. These medicines may hide a fever.  Women should inform their doctor if they wish to become pregnant or think they might be pregnant. There is a potential for serious side effects to an unborn child. Talk to your health care professional or pharmacist for more information. Do not breast-feed an infant while taking this medicine.  What side effects may I notice from  receiving this medicine?  Side effects that you should report to your doctor or health care professional as soon as possible:  -allergic reactions like skin rash, itching or hives, swelling of the face, lips, or tongue  -low blood counts - this medicine may decrease the number of white blood cells, red blood cells and platelets. You may be at increased risk for infections and bleeding.  -signs of infection - fever or chills, cough, sore throat, pain or difficulty passing urine  -signs of decreased platelets or bleeding - bruising, pinpoint red spots on the skin, black, tarry stools, blood in the urine  -signs of decreased red blood cells - unusually weak or tired, fainting spells, lightheadedness  -breathing problems  -chest pain  -  mouth sores  -nausea and vomiting  -pain, swelling, redness at site where injected  -pain, tingling, numbness in the hands or feet  -stomach pain  -swelling of ankles, feet, hands  -unusual bleeding  Side effects that usually do not require medical attention (report to your doctor or health care professional if they continue or are bothersome):  -constipation  -diarrhea  -hair loss  -loss of appetite  -stomach upset  This list may not describe all possible side effects. Call your doctor for medical advice about side effects. You may report side effects to FDA at 1-800-FDA-1088.  Where should I keep my medicine?  This drug is given in a hospital or clinic and will not be stored at home.  NOTE: This sheet is a summary. It may not cover all possible information. If you have questions about this medicine, talk to your doctor, pharmacist, or health care provider.   2018 Elsevier/Gold Standard (2008-02-01 18:45:54)

## 2016-12-07 MED ORDER — CEFAZOLIN IN D5W 1 GM/50ML IV SOLN: 1.0000 g | Freq: Once | INTRAVENOUS | Status: AC

## 2016-12-07 MED ORDER — SODIUM CHLORIDE 0.9 % IR SOLN
Freq: Once | Status: AC
  Filled 2016-12-07: qty 2

## 2016-12-08 ENCOUNTER — Other Ambulatory Visit: Payer: Self-pay | Admitting: *Deleted

## 2016-12-08 ENCOUNTER — Encounter
Admission: RE | Disposition: A | Discharge status: Home / Self Care | Payer: Self-pay | Source: Ambulatory Visit | Attending: Vascular Surgery

## 2016-12-08 ENCOUNTER — Ambulatory Visit
Admission: RE | Admit: 2016-12-08 | Discharge: 2016-12-08 | Disposition: A | Discharge status: Home / Self Care | Payer: Medicare Other | Source: Ambulatory Visit | Attending: Vascular Surgery | Admitting: Vascular Surgery

## 2016-12-08 ENCOUNTER — Telehealth: Payer: Self-pay | Admitting: Internal Medicine

## 2016-12-08 DIAGNOSIS — I10 Essential (primary) hypertension: Secondary | ICD-10-CM | POA: Diagnosis not present

## 2016-12-08 DIAGNOSIS — E559 Vitamin D deficiency, unspecified: Secondary | ICD-10-CM | POA: Diagnosis not present

## 2016-12-08 DIAGNOSIS — G5603 Carpal tunnel syndrome, bilateral upper limbs: Secondary | ICD-10-CM | POA: Insufficient documentation

## 2016-12-08 DIAGNOSIS — M179 Osteoarthritis of knee, unspecified: Secondary | ICD-10-CM | POA: Diagnosis not present

## 2016-12-08 DIAGNOSIS — M8588 Other specified disorders of bone density and structure, other site: Secondary | ICD-10-CM | POA: Diagnosis not present

## 2016-12-08 DIAGNOSIS — Z7982 Long term (current) use of aspirin: Secondary | ICD-10-CM | POA: Insufficient documentation

## 2016-12-08 DIAGNOSIS — Z8049 Family history of malignant neoplasm of other genital organs: Secondary | ICD-10-CM | POA: Diagnosis not present

## 2016-12-08 DIAGNOSIS — Z90710 Acquired absence of both cervix and uterus: Secondary | ICD-10-CM | POA: Diagnosis not present

## 2016-12-08 DIAGNOSIS — E7800 Pure hypercholesterolemia, unspecified: Secondary | ICD-10-CM | POA: Diagnosis not present

## 2016-12-08 DIAGNOSIS — C250 Malignant neoplasm of head of pancreas: Secondary | ICD-10-CM

## 2016-12-08 DIAGNOSIS — C259 Malignant neoplasm of pancreas, unspecified: Secondary | ICD-10-CM | POA: Diagnosis present

## 2016-12-08 DIAGNOSIS — Z803 Family history of malignant neoplasm of breast: Secondary | ICD-10-CM | POA: Insufficient documentation

## 2016-12-08 DIAGNOSIS — E119 Type 2 diabetes mellitus without complications: Secondary | ICD-10-CM | POA: Diagnosis not present

## 2016-12-08 DIAGNOSIS — Z8542 Personal history of malignant neoplasm of other parts of uterus: Secondary | ICD-10-CM | POA: Insufficient documentation

## 2016-12-08 HISTORY — PX: PORTA CATH INSERTION: CATH118285

## 2016-12-08 SURGERY — PORTA CATH INSERTION
Anesthesia: Moderate Sedation

## 2016-12-08 MED ORDER — FENTANYL CITRATE (PF) 100 MCG/2ML IJ SOLN
INTRAMUSCULAR | Status: AC
  Filled 2016-12-08: qty 2

## 2016-12-08 MED ORDER — MIDAZOLAM HCL 2 MG/2ML IJ SOLN
INTRAMUSCULAR | Status: AC
  Filled 2016-12-08: qty 4

## 2016-12-08 MED ORDER — LIDOCAINE-EPINEPHRINE (PF) 2 %-1:200000 IJ SOLN
INTRAMUSCULAR | Status: AC
  Filled 2016-12-08: qty 20

## 2016-12-08 MED ORDER — SODIUM CHLORIDE 0.9 % IV SOLN
INTRAVENOUS | Status: DC
  Administered 2016-12-08: 11:00:00 via INTRAVENOUS

## 2016-12-08 MED ORDER — FENTANYL CITRATE (PF) 100 MCG/2ML IJ SOLN
INTRAMUSCULAR | Status: DC | PRN
  Administered 2016-12-08 (×2): 25 ug via INTRAVENOUS

## 2016-12-08 MED ORDER — MIDAZOLAM HCL 2 MG/2ML IJ SOLN
INTRAMUSCULAR | Status: DC | PRN
  Administered 2016-12-08: 2 mg via INTRAVENOUS
  Administered 2016-12-08: 0.5 mg via INTRAVENOUS

## 2016-12-08 MED ORDER — CEFAZOLIN IN D5W 1 GM/50ML IV SOLN
1.0000 g | Freq: Once | INTRAVENOUS | Status: AC
  Administered 2016-12-08: 1 g via INTRAVENOUS

## 2016-12-08 MED ORDER — SODIUM CHLORIDE 0.9 % IR SOLN
Freq: Once | Status: DC
  Filled 2016-12-08: qty 2

## 2016-12-08 MED ORDER — HEPARIN (PORCINE) IN NACL 2-0.9 UNIT/ML-% IJ SOLN
INTRAMUSCULAR | Status: AC
Start: 2016-12-08 — End: 2016-12-08
  Filled 2016-12-08: qty 500

## 2016-12-08 SURGICAL SUPPLY — 9 items
DERMABOND ADVANCED (GAUZE/BANDAGES/DRESSINGS) ×2
DERMABOND ADVANCED .7 DNX12 (GAUZE/BANDAGES/DRESSINGS) ×1 IMPLANT
KIT PORT POWER 8FR ISP CVUE (Catheter) ×3 IMPLANT
PACK ANGIOGRAPHY (CUSTOM PROCEDURE TRAY) ×3 IMPLANT
PAD GROUND ADULT SPLIT (MISCELLANEOUS) ×3 IMPLANT
PENCIL ELECTRO HAND CTR (MISCELLANEOUS) ×3 IMPLANT
SUT MNCRL AB 4-0 PS2 18 (SUTURE) ×6 IMPLANT
SUT PROLENE 0 CT 1 30 (SUTURE) ×3 IMPLANT
SUTURE VIC 3-0 (SUTURE) ×3 IMPLANT

## 2016-12-08 NOTE — Op Note (Signed)
      [facility]       Operative Note  Date: 12/08/2016  Preoperative diagnosis:  1. Pancreatic cancer  Postoperative diagnosis:  Same as above  Procedures: #1. Ultrasound guidance for vascular access to the right internal jugular vein. #2. Fluoroscopic guidance for placement of catheter. #3. Placement of CT compatible Port-A-Cath, right internal jugular vein.  Surgeon: Leotis Pain, MD.   Anesthesia: Local with moderate conscious sedation for approximately 20 minutes using 2.5 mg of Versed and 50 mcg of Fentanyl  Fluoroscopy time: less than 1 minute  Contrast used: 0  Estimated blood loss: minimal  Indication for the procedure:  The patient is a 82 y.o.female with pancreatic cancer.  The patient needs a Port-A-Cath for durable venous access, chemotherapy, lab draws, and CT scans. We are asked to place this. Risks and benefits were discussed and informed consent was obtained.  Description of procedure: The patient was brought to the vascular and interventional radiology suite.  Moderate conscious sedation was administered throughout the procedure during a face to face encounter with the patient with my supervision of the RN administering medicines and monitoring the patient's vital signs, pulse oximetry, telemetry and mental status throughout from the start of the procedure until the patient was taken to the recovery room. The right neck chest and shoulder were sterilely prepped and draped, and a sterile surgical field was created. Ultrasound was used to help visualize a patent right internal jugular vein. This was then accessed under direct ultrasound guidance without difficulty with the Seldinger needle and a permanent image was recorded. A J-wire was placed. After skin nick and dilatation, the peel-away sheath was then placed over the wire. I then anesthetized an area under the clavicle approximately 1-2 fingerbreadths. A transverse incision was created and an inferior  pocket was created with electrocautery and blunt dissection. The port was then brought onto the field, placed into the pocket and secured to the chest wall with 2 Prolene sutures. The catheter was connected to the port and tunneled from the subclavicular incision to the access site. Fluoroscopic guidance was then used to cut the catheter to an appropriate length. The catheter was then placed through the peel-away sheath and the peel-away sheath was removed. The catheter tip was parked in excellent location under fluorocoscopic guidance in the mid SVC. The pocket was then irrigated with antibiotic impregnated saline and the wound was closed with a running 3-0 Vicryl and a 4-0 Monocryl. The access incision was closed with a single 4-0 Monocryl. The Huber needle was used to withdraw blood and flush the port with heparinized saline. Dermabond was then placed as a dressing. The patient tolerated the procedure well and was taken to the recovery room in stable condition.   Leotis Pain 12/08/2016 12:25 PM   This note was created with Dragon Medical transcription system. Any errors in dictation are purely unintentional.

## 2016-12-08 NOTE — H&P (Signed)
[facility] History & Physical Update  The patient was interviewed and re-examined.  The patient's previous History and Physical has been reviewed and is unchanged.  There is no change in the plan of care. We plan to proceed with the scheduled procedure.  Leotis Pain, MD  12/08/2016, 9:56 AM

## 2016-12-08 NOTE — Telephone Encounter (Signed)
Per Shirlean Mylar in cancer center [facility] scheduling team. Bridgett from Dr. Dorthula Perfect office called regarding surgical oncology referral. Dr. Dorthula Perfect is unable to set pt up with [facility] for surgical consult. They do not take her insurance. So Dr Kathalene Frames is ok with whatever Dr Rogue Bussing decides about where to send her or the next step.  Per v/o Dr. Rogue Bussing- since pt has been established by [facility]- referral order entered for [facility] surgical oncology-Dr. Cloyd Stagers or one of his colleagues. msg sent to Robin in cancer center scheduling team to arrange for [facility] referral.

## 2016-12-09 ENCOUNTER — Encounter: Payer: Self-pay | Admitting: Vascular Surgery

## 2016-12-09 ENCOUNTER — Inpatient Hospital Stay: Payer: Medicare Other | Attending: Internal Medicine | Admitting: Internal Medicine

## 2016-12-09 ENCOUNTER — Inpatient Hospital Stay: Payer: Medicare Other

## 2016-12-09 VITALS — BP 138/79 | HR 102 | Temp 98.4°F | Resp 18 | Wt 169.9 lb

## 2016-12-09 DIAGNOSIS — E559 Vitamin D deficiency, unspecified: Secondary | ICD-10-CM | POA: Diagnosis not present

## 2016-12-09 DIAGNOSIS — T451X5S Adverse effect of antineoplastic and immunosuppressive drugs, sequela: Secondary | ICD-10-CM | POA: Diagnosis not present

## 2016-12-09 DIAGNOSIS — Z7982 Long term (current) use of aspirin: Secondary | ICD-10-CM | POA: Diagnosis not present

## 2016-12-09 DIAGNOSIS — M8580 Other specified disorders of bone density and structure, unspecified site: Secondary | ICD-10-CM | POA: Insufficient documentation

## 2016-12-09 DIAGNOSIS — R630 Anorexia: Secondary | ICD-10-CM

## 2016-12-09 DIAGNOSIS — K56609 Unspecified intestinal obstruction, unspecified as to partial versus complete obstruction: Secondary | ICD-10-CM | POA: Diagnosis not present

## 2016-12-09 DIAGNOSIS — C786 Secondary malignant neoplasm of retroperitoneum and peritoneum: Secondary | ICD-10-CM | POA: Diagnosis not present

## 2016-12-09 DIAGNOSIS — D701 Agranulocytosis secondary to cancer chemotherapy: Secondary | ICD-10-CM | POA: Insufficient documentation

## 2016-12-09 DIAGNOSIS — Z5111 Encounter for antineoplastic chemotherapy: Secondary | ICD-10-CM | POA: Diagnosis present

## 2016-12-09 DIAGNOSIS — E119 Type 2 diabetes mellitus without complications: Secondary | ICD-10-CM | POA: Diagnosis not present

## 2016-12-09 DIAGNOSIS — I10 Essential (primary) hypertension: Secondary | ICD-10-CM | POA: Insufficient documentation

## 2016-12-09 DIAGNOSIS — G5600 Carpal tunnel syndrome, unspecified upper limb: Secondary | ICD-10-CM | POA: Diagnosis not present

## 2016-12-09 DIAGNOSIS — M7989 Other specified soft tissue disorders: Secondary | ICD-10-CM | POA: Insufficient documentation

## 2016-12-09 DIAGNOSIS — E7800 Pure hypercholesterolemia, unspecified: Secondary | ICD-10-CM | POA: Insufficient documentation

## 2016-12-09 DIAGNOSIS — C250 Malignant neoplasm of head of pancreas: Secondary | ICD-10-CM | POA: Diagnosis not present

## 2016-12-09 DIAGNOSIS — Z79899 Other long term (current) drug therapy: Secondary | ICD-10-CM | POA: Diagnosis not present

## 2016-12-09 MED ORDER — LIDOCAINE-PRILOCAINE 2.5-2.5 % EX CREA: 1.0000 "application " | TOPICAL_CREAM | CUTANEOUS | 1 refills | Status: AC | PRN

## 2016-12-09 MED ORDER — MIRTAZAPINE 15 MG PO TABS: 15.0000 mg | ORAL_TABLET | Freq: Every day | ORAL | 3 refills | Status: AC

## 2016-12-09 MED ORDER — FENTANYL 25 MCG/HR TD PT72: 25.0000 ug | MEDICATED_PATCH | TRANSDERMAL | 0 refills | Status: AC

## 2016-12-09 MED ORDER — PROCHLORPERAZINE MALEATE 10 MG PO TABS: 10.0000 mg | ORAL_TABLET | Freq: Four times a day (QID) | ORAL | 3 refills | Status: AC | PRN

## 2016-12-09 MED ORDER — ONDANSETRON HCL 8 MG PO TABS: 8.0000 mg | ORAL_TABLET | Freq: Three times a day (TID) | ORAL | 3 refills | Status: AC | PRN

## 2016-12-09 MED ORDER — HYDROCODONE-ACETAMINOPHEN 5-325 MG PO TABS: 1.0000 | ORAL_TABLET | Freq: Four times a day (QID) | ORAL | 0 refills | Status: DC | PRN

## 2016-12-09 NOTE — Progress Notes (Signed)
[name] CONSULT NOTE  Patient Care Team: Ezequiel Kayser, MD as PCP - General (Internal Medicine)  CHIEF COMPLAINTS/PURPOSE OF CONSULTATION:  Pancreatic cancer  #  Oncology History   # FEB 2018- PANCREAS HEAD Adeno ca [mass encasing SMV, splenic Vein, proximal portal Vein];  EUS Bx-fibrous tissue suspicious for invasive adeno ca [Feb 12th 2018- 5 x 5cm; 3 peripancreatic LN- malignant appearing; no lesions in liver]; # Ca- 19-9 [>100K]; MARCH 2018- PET scan- ~5cm [? Vascular involvement; celiac trunk]; ? Omental mets; peritoneal nodularity;   # Obstructive jaundice [bilirubin 23 Feb 2nd 2018-s/p ERCP with stenting; brushing- inconclusive; [facility] feb 5th 2018]   # Uterine cancer ? Stage; s/p TAH & BSO [[facility] ? 2-3 years ago]       Malignant neoplasm of head of pancreas (HCC)     HISTORY OF PRESENTING ILLNESS:  Carolyn Adkins 82 y.o.  female recently diagnosed with pancreatic Adenocarcinoma- is here to review the results of her PET scan/next plan of care.  Patient is a poor appetite. Positive for weight loss. Positive for bilateral extremity. No cramping. Complaints of pain in the back. She states her pain is approximately 8 and a scale of 10. She has been taking only Tylenol. Continued leg swelling. Skin no itching improving.  ROS: A complete 10 point review of system is done which is negative except mentioned above in history of present illness  MEDICAL HISTORY:  Past Medical History:  Diagnosis Date  . Anemia 11/10/2016  . Arthritis 11/09/2016  . Cancer ([place])    uterine ca  . Carpal tunnel syndrome on right 08/09/2015  . Carpal tunnel syndrome, left 08/09/2015  . Essential hypertension 11/09/2016  . Hypercholesterolemia 11/10/2016  . Hypertension   . OA (osteoarthritis) of knee 11/10/2016  . Osteopenia 11/10/2016  . Pancreatic mass 11/09/2016  . Primary osteoarthritis of first carpometacarpal joint of right hand 01/01/2016  . Radiculopathy of lumbosacral region  09/12/2014  . Small bowel obstruction 02/03/2015  . Type 2 diabetes mellitus without complication, without long-term current use of insulin ([place]) 02/27/2014   Overview:  HgbA1c reached 6.5% 09/09/2016  . Vitamin D deficiency 11/10/2016    SURGICAL HISTORY: Past Surgical History:  Procedure Laterality Date  . ABDOMINAL HYSTERECTOMY    . PORTA CATH INSERTION N/A 12/08/2016   Procedure: Glori Luis Cath Insertion;  Surgeon: Algernon Huxley, MD;  Location: [facility];  Service: Cardiovascular;  Laterality: N/A;    SOCIAL HISTORY: Social History   Social History  . Marital status: Widowed    Spouse name: N/A  . Number of children: N/A  . Years of education: N/A   Occupational History  . Not on file.   Social History Main Topics  . Smoking status: Never Smoker  . Smokeless tobacco: Not on file  . Alcohol use Not on file  . Drug use: Unknown  . Sexual activity: Not on file   Other Topics Concern  . Not on file   Social History Narrative  . No narrative on file    FAMILY HISTORY: mother- breast cancer- 66s [died at 65]; sister [? Cancer- 33s]; sister [uterus- 90s]; 7 girls; brother  One died on stomach aneursym.  Family History  Problem Relation Age of Onset  . Breast cancer Mother   . Cancer Sister   . Cancer Sister     uterine    ALLERGIES:  has No Known Allergies.  MEDICATIONS:  Current Outpatient Prescriptions  Medication Sig Dispense Refill  .  acetaminophen (TYLENOL) 500 MG tablet Take 1,000 mg by mouth 2 (two) times daily.     Marland Kitchen aspirin EC 81 MG tablet Take 81 mg by mouth daily.     . Cholecalciferol (VITAMIN D3) 2000 units capsule Take 2,000 Units by mouth daily.    Marland Kitchen docusate sodium (COLACE) 100 MG capsule Take 100 mg by mouth daily as needed.    . doxepin (SINEQUAN) 10 MG capsule Take 10 mg by mouth. Take 2-3 tablets (20-30 mg) by mouth nightly as needed for itching    . fluticasone (FLONASE) 50 MCG/ACT nasal spray Place 1 spray into the nose daily as needed.      . Multiple Vitamin (MULTIVITAMIN) tablet Take 1 tablet by mouth daily.    . Omega-3 Fatty Acids (FISH OIL) 1000 MG CAPS Take 1 capsule by mouth daily.     . fentaNYL (DURAGESIC - DOSED MCG/HR) 25 MCG/HR patch Place 1 patch (25 mcg total) onto the skin every 3 (three) days. 10 patch 0  . HYDROcodone-acetaminophen (NORCO/VICODIN) 5-325 MG tablet Take 1 tablet by mouth every 6 (six) hours as needed for moderate pain. 40 tablet 0  . lidocaine-prilocaine (EMLA) cream Apply 1 application topically as needed. 30 g 1  . mirtazapine (REMERON) 15 MG tablet Take 1 tablet (15 mg total) by mouth at bedtime. 30 tablet 3  . ondansetron (ZOFRAN) 8 MG tablet Take 1 tablet (8 mg total) by mouth every 8 (eight) hours as needed for nausea or vomiting. 30 tablet 3  . prochlorperazine (COMPAZINE) 10 MG tablet Take 1 tablet (10 mg total) by mouth every 6 (six) hours as needed for nausea or vomiting. 30 tablet 3   Current Facility-Administered Medications  Medication Dose Route Frequency Provider Last Rate Last Dose  . ceFAZolin (ANCEF) IVPB 1 g/50 mL premix  1 g Intravenous Once Algernon Huxley, MD      . gentamicin (GARAMYCIN) 80 mg in sodium chloride irrigation 0.9 % 500 mL irrigation   Irrigation Once Algernon Huxley, MD          .  PHYSICAL EXAMINATION: ECOG PERFORMANCE STATUS: 2 - Symptomatic, <50% confined to bed  Vitals:   12/09/16 1430  BP: 138/79  Pulse: (!) 102  Resp: 18  Temp: 98.4 F (36.9 C)   Filed Weights   12/09/16 1430  Weight: 169 lb 13.8 oz (77 kg)    GENERAL: Well-nourished well-developed; Alert, no distress and comfortable.   Accompanied by her sister. She is walking with cane.  EYES: no pallor or icterus OROPHARYNX: no thrush or ulceration; good dentition  NECK: supple, no masses felt LYMPH:  no palpable lymphadenopathy in the cervical, axillary or inguinal regions LUNGS: clear to auscultation and  No wheeze or crackles HEART/CVS: regular rate & rhythm and no murmurs; Bil LE lower  extremity edema ABDOMEN: abdomen soft, non-tender and normal bowel sounds Musculoskeletal:no cyanosis of digits and no clubbing  PSYCH: alert & oriented x 3 with fluent speech NEURO: no focal motor/sensory deficits SKIN:  no rashes or significant lesions  LABORATORY DATA:  I have reviewed the data as listed Lab Results  Component Value Date   WBC 5.1 12/02/2016   HGB 9.1 (L) 12/02/2016   HCT 27.3 (L) 12/02/2016   MCV 86.4 12/02/2016   PLT 298 12/02/2016    Recent Labs  11/08/16 1151 12/02/16 1615  NA 133* 138  K 2.8* 3.9  CL 96* 103  CO2 27 24  GLUCOSE 153* 107*  BUN 19 11  CREATININE UNABLE TO REPORT DUE TO ICTERUS 0.82  CALCIUM 9.4 9.4  GFRNONAA NOT CALCULATED >60  GFRAA NOT CALCULATED >60  PROT 6.9 7.0  ALBUMIN 3.0* 2.9*  AST 215* 35  ALT 220* 25  ALKPHOS 295* 98  BILITOT 27.9* 5.0*    RADIOGRAPHIC STUDIES: I have personally reviewed the radiological images as listed and agreed with the findings in the report. Nm Pet Image Initial (pi) Skull Base To Thigh  Result Date: 12/04/2016 CLINICAL DATA:  Initial treatment strategy for pancreatic head malignancy. History of uterine cancer. EXAM: NUCLEAR MEDICINE PET SKULL BASE TO THIGH TECHNIQUE: 12.4 mCi F-18 FDG was injected intravenously. Full-ring PET imaging was performed from the skull base to thigh after the radiotracer. CT data was obtained and used for attenuation correction and anatomic localization. FASTING BLOOD GLUCOSE:  Value: 88 mg/dl COMPARISON:  Multiple exams, including 11/08/2016 ultrasound and CT from 01/27/2015 FINDINGS: NECK No hypermetabolic lymph nodes in the neck. Multilevel cervical spondylosis and degenerative disc disease likely causing impingement. CHEST No hypermetabolic mediastinal or hilar nodes. No suspicious pulmonary nodules on the CT data. ABDOMEN/PELVIS An expandable biliary stent is present. There is in amorphous mass of the pancreatic body extending into the pancreatic head, the region of  associated abnormal hypermetabolic activity measures approximately 5.3 cm, and has a maximum standard uptake value of 9.5. This mass skirts the medial margin of the stent wall anatomic localization of vascular structures is problematic on today' s noncontrast CT data, I suspect that the celiac trunk and its branches are encompassed by the mass. Indistinctness of surrounding tissue planes noted. Despite the stent there some mild intrahepatic biliary dilatation and a thickened appearance of the extrahepatic biliary tree. There is likely some periportal edema. I do not see a discrete hepatic metastatic lesion. High density material in the gallbladder, probably sludge or contrast medium. Mildly dilated dorsal pancreatic duct in the pancreatic tail. Nodularity in the gastrohepatic ligament could be from varices or lymph nodes. Poor delineation of tissue planes in the peripancreatic region. Diffuse mesenteric edema. A 1.2 by 1.7 cm nodular focus in the omentum at the midline of image 153/3 has a maximum standard uptake value of 4.8, concerning for a metastatic lesion. There some other small hypodense rounded structures which are not hypermetabolic but could represent lymph nodes, including a 1.4 cm structure adjacent to the gallbladder on image 140/3 and a 1.2 cm in short axis structure adjacent to the anastomotic staple line in the right colon on image 154/3. There is diffuse mesenteric edema and some mild mesenteric nodularity. Mild pelvic ascites. Low-grade subcutaneous edema noted There is some accentuated metabolic activity in the right colon along the anastomotic staple line there is a collection of gas and density along the right paracolic gutter which is probably from a ribbon light flattened loop of small bowel. There is some colon wall thickening in the right abdomen on image 152 of series 3 just distal to the anastomotic staple line, although this could be peristaltic. SKELETON Thoracic and lumbar spondylosis.  Cervical spondylosis is thought to cause multilevel impingement. No compelling findings for osseous metastatic disease. IMPRESSION: 1. Hypermetabolic activity associated with the pancreatic head and body mass measures about 5.3 cm in diameter with maximum SUV 9.5. I would suspect the celiac trunk and its branches to be encompassed by the mass, although today's exam does not constitute a detailed vascular anatomic assessment. 2. There is a hypermetabolic nodular focus in the supraumbilical omentum suspicious for a metastatic focus. Other  small nodular densities along the peritoneal margin are not overtly hypermetabolic. 3. Diffuse mesenteric edema with mild pelvic ascites. Indistinct fat planes along the porta hepatis. 4. Apparent wall thickening of the extrahepatic biliary tree extending to the expandable stent. Mild intrahepatic biliary dilatation is suspected. 5. Considerable cervical spondylosis and degenerative disc disease likely causing multilevel impingement. Electronically Signed   By: Van Clines M.D.   On: 12/04/2016 10:30   US Venous Img Lower Bilateral  Result Date: 12/04/2016 CLINICAL DATA:  Swelling for about 1 month EXAM: BILATERAL LOWER EXTREMITY VENOUS DOPPLER ULTRASOUND TECHNIQUE: Gray-scale sonography with graded compression, as well as color Doppler and duplex ultrasound were performed to evaluate the lower extremity deep venous systems from the level of the common femoral vein and including the common femoral, femoral, profunda femoral, popliteal and calf veins including the posterior tibial, peroneal and gastrocnemius veins when visible. The superficial great saphenous vein was also interrogated. Spectral Doppler was utilized to evaluate flow at rest and with distal augmentation maneuvers in the common femoral, femoral and popliteal veins. COMPARISON:  None. FINDINGS: RIGHT LOWER EXTREMITY Common Femoral Vein: No evidence of thrombus. Normal compressibility, respiratory phasicity and  response to augmentation. Saphenofemoral Junction: No evidence of thrombus. Normal compressibility and flow on color Doppler imaging. Profunda Femoral Vein: No evidence of thrombus. Normal compressibility and flow on color Doppler imaging. Femoral Vein: No evidence of thrombus. Normal compressibility, respiratory phasicity and response to augmentation. Popliteal Vein: No evidence of thrombus. Normal compressibility, respiratory phasicity and response to augmentation. Calf Veins: No evidence of thrombus. Normal compressibility and flow on color Doppler imaging. Superficial Great Saphenous Vein: No evidence of thrombus. Normal compressibility and flow on color Doppler imaging. Venous Reflux:  None. Other Findings:  None. LEFT LOWER EXTREMITY Common Femoral Vein: No evidence of thrombus. Normal compressibility, respiratory phasicity and response to augmentation. Saphenofemoral Junction: No evidence of thrombus. Normal compressibility and flow on color Doppler imaging. Profunda Femoral Vein: No evidence of thrombus. Normal compressibility and flow on color Doppler imaging. Femoral Vein: No evidence of thrombus. Normal compressibility, respiratory phasicity and response to augmentation. Popliteal Vein: No evidence of thrombus. Normal compressibility, respiratory phasicity and response to augmentation. Calf Veins: No evidence of thrombus. Normal compressibility and flow on color Doppler imaging. Superficial Great Saphenous Vein: No evidence of thrombus. Normal compressibility and flow on color Doppler imaging. Venous Reflux:  None. Other Findings:  None. IMPRESSION: No evidence of deep venous thrombosis bilateral lower extremity. Electronically Signed   By: Lahoma Crocker M.D.   On: 12/04/2016 13:00    ASSESSMENT & PLAN:   Malignant neoplasm of head of pancreas ([place]) # STAGE IV- Pancreatic adenocarcinoma- abutting the vasculature/ peripancreatic lymph nodes; likely omental/peritoneal nodularity- metastases based on  imaging.  # Patient not a candidate for clinical trial including nabs given the poor/borderline PS-2.  Recommend palliative chemotherapy with gemcitabine Abraxane every 2 weeks. Refer to surgery per family request. I do not think patient is a surgical candidate/ or the disease is resectable based on current imaging.  # Discussed the potential side effects including but not limited to-increasing fatigue, nausea vomiting, diarrhea, hair loss, sores in the mouth, increase risk of infection and also neuropathy.   # Reviewed/counselled regarding the goals of care- being palliative/treatment are usually indefinite-until progression or side effects. Goal is to maintain quality of life as the disease is incurable.  # Patient will be candidate for genetic testing given multiple malignancies-personal/ family history.   # Bilateral lower extremity swelling  dependent edema recommend leg elevation; ultrasound negative for clots  # Pain control- recommend fentanyl patch 25 g hydrocodone every 6 hours as needed discussed regarding constipation.  # Loss of appetite/difficulty sleeping at night recommend Remeron. New prescription given.  # Patient will follow-up with me in 1 week; labs; possible IV fluids; in 2 weeks labs M.D.; day-2  Chemotherapy. Plan also discussed with pt's PCP- Dr.Thies.       Cammie Sickle, MD 12/09/2016 3:48 PM

## 2016-12-09 NOTE — Assessment & Plan Note (Addendum)
#   STAGE IV- Pancreatic adenocarcinoma- abutting the vasculature/ peripancreatic lymph nodes; likely omental/peritoneal nodularity- metastases based on imaging.  # Patient not a candidate for clinical trial including nabs given the poor/borderline PS-2.  Recommend palliative chemotherapy with gemcitabine Abraxane every 2 weeks. Refer to surgery per family request. I do not think patient is a surgical candidate/ or the disease is resectable based on current imaging.  # Discussed the potential side effects including but not limited to-increasing fatigue, nausea vomiting, diarrhea, hair loss, sores in the mouth, increase risk of infection and also neuropathy.   # Reviewed/counselled regarding the goals of care- being palliative/treatment are usually indefinite-until progression or side effects. Goal is to maintain quality of life as the disease is incurable.  # Patient will be candidate for genetic testing given multiple malignancies-personal/ family history.   # Bilateral lower extremity swelling dependent edema recommend leg elevation; ultrasound negative for clots  # Pain control- recommend fentanyl patch 25 g hydrocodone every 6 hours as needed discussed regarding constipation.  # Loss of appetite/difficulty sleeping at night recommend Remeron. New prescription given.  # Patient will follow-up with me in 1 week; labs; possible IV fluids; in 2 weeks labs M.D.; day-2  Chemotherapy. Plan also discussed with pt's PCP- Dr.Thies.

## 2016-12-10 ENCOUNTER — Other Ambulatory Visit: Payer: Self-pay | Admitting: Internal Medicine

## 2016-12-10 ENCOUNTER — Inpatient Hospital Stay: Payer: Medicare Other

## 2016-12-10 VITALS — BP 136/82 | HR 78 | Temp 97.9°F | Resp 18

## 2016-12-10 DIAGNOSIS — C250 Malignant neoplasm of head of pancreas: Secondary | ICD-10-CM

## 2016-12-10 DIAGNOSIS — Z5111 Encounter for antineoplastic chemotherapy: Secondary | ICD-10-CM | POA: Diagnosis not present

## 2016-12-10 DIAGNOSIS — Z452 Encounter for adjustment and management of vascular access device: Secondary | ICD-10-CM

## 2016-12-10 MED ORDER — PACLITAXEL PROTEIN-BOUND CHEMO INJECTION 100 MG
100.0000 mg/m2 | Freq: Once | INTRAVENOUS | Status: AC
  Administered 2016-12-10: 175 mg via INTRAVENOUS
  Filled 2016-12-10: qty 35

## 2016-12-10 MED ORDER — SODIUM CHLORIDE 0.9 % IV SOLN
1400.0000 mg | Freq: Once | INTRAVENOUS | Status: AC
  Administered 2016-12-10: 1400 mg via INTRAVENOUS
  Filled 2016-12-10: qty 26.3

## 2016-12-10 MED ORDER — SODIUM CHLORIDE 0.9% FLUSH
10.0000 mL | INTRAVENOUS | Status: DC | PRN
  Administered 2016-12-10: 10 mL
  Filled 2016-12-10: qty 10

## 2016-12-10 MED ORDER — PROCHLORPERAZINE MALEATE 10 MG PO TABS
10.0000 mg | ORAL_TABLET | Freq: Once | ORAL | Status: AC
  Administered 2016-12-10: 10 mg via ORAL
  Filled 2016-12-10: qty 1

## 2016-12-10 MED ORDER — HEPARIN SOD (PORK) LOCK FLUSH 100 UNIT/ML IV SOLN
500.0000 [IU] | Freq: Once | INTRAVENOUS | Status: AC | PRN
  Administered 2016-12-10: 500 [IU]

## 2016-12-10 MED ORDER — SODIUM CHLORIDE 0.9 % IV SOLN
Freq: Once | INTRAVENOUS | Status: AC
  Administered 2016-12-10: 09:00:00 via INTRAVENOUS
  Filled 2016-12-10: qty 1000

## 2016-12-10 NOTE — Patient Instructions (Signed)
Gemcitabine injection  What is this medicine?  GEMCITABINE (jem SIT a been) is a chemotherapy drug. This medicine is used to treat many types of cancer like breast cancer, lung cancer, pancreatic cancer, and ovarian cancer.  This medicine may be used for other purposes; ask your health care provider or pharmacist if you have questions.  COMMON BRAND NAME(S): Gemzar  What should I tell my health care provider before I take this medicine?  They need to know if you have any of these conditions:  -blood disorders  -infection  -kidney disease  -liver disease  -recent or ongoing radiation therapy  -an unusual or allergic reaction to gemcitabine, other chemotherapy, other medicines, foods, dyes, or preservatives  -pregnant or trying to get pregnant  -breast-feeding  How should I use this medicine?  This drug is given as an infusion into a vein. It is administered in a hospital or clinic by a specially trained health care professional.  Talk to your pediatrician regarding the use of this medicine in children. Special care may be needed.  Overdosage: If you think you have taken too much of this medicine contact a poison control center or emergency room at once.  NOTE: This medicine is only for you. Do not share this medicine with others.  What if I miss a dose?  It is important not to miss your dose. Call your doctor or health care professional if you are unable to keep an appointment.  What may interact with this medicine?  -medicines to increase blood counts like filgrastim, pegfilgrastim, sargramostim  -some other chemotherapy drugs like cisplatin  -vaccines  Talk to your doctor or health care professional before taking any of these medicines:  -acetaminophen  -aspirin  -ibuprofen  -ketoprofen  -naproxen  This list may not describe all possible interactions. Give your health care provider a list of all the medicines, herbs, non-prescription drugs, or dietary supplements you use. Also tell them if you smoke, drink alcohol,  or use illegal drugs. Some items may interact with your medicine.  What should I watch for while using this medicine?  Visit your doctor for checks on your progress. This drug may make you feel generally unwell. This is not uncommon, as chemotherapy can affect healthy cells as well as cancer cells. Report any side effects. Continue your course of treatment even though you feel ill unless your doctor tells you to stop.  In some cases, you may be given additional medicines to help with side effects. Follow all directions for their use.  Call your doctor or health care professional for advice if you get a fever, chills or sore throat, or other symptoms of a cold or flu. Do not treat yourself. This drug decreases your body's ability to fight infections. Try to avoid being around people who are sick.  This medicine may increase your risk to bruise or bleed. Call your doctor or health care professional if you notice any unusual bleeding.  Be careful brushing and flossing your teeth or using a toothpick because you may get an infection or bleed more easily. If you have any dental work done, tell your dentist you are receiving this medicine.  Avoid taking products that contain aspirin, acetaminophen, ibuprofen, naproxen, or ketoprofen unless instructed by your doctor. These medicines may hide a fever.  Women should inform their doctor if they wish to become pregnant or think they might be pregnant. There is a potential for serious side effects to an unborn child. Talk to your health   care professional or pharmacist for more information. Do not breast-feed an infant while taking this medicine.  What side effects may I notice from receiving this medicine?  Side effects that you should report to your doctor or health care professional as soon as possible:  -allergic reactions like skin rash, itching or hives, swelling of the face, lips, or tongue  -low blood counts - this medicine may decrease the number of white blood cells,  red blood cells and platelets. You may be at increased risk for infections and bleeding.  -signs of infection - fever or chills, cough, sore throat, pain or difficulty passing urine  -signs of decreased platelets or bleeding - bruising, pinpoint red spots on the skin, black, tarry stools, blood in the urine  -signs of decreased red blood cells - unusually weak or tired, fainting spells, lightheadedness  -breathing problems  -chest pain  -mouth sores  -nausea and vomiting  -pain, swelling, redness at site where injected  -pain, tingling, numbness in the hands or feet  -stomach pain  -swelling of ankles, feet, hands  -unusual bleeding  Side effects that usually do not require medical attention (report to your doctor or health care professional if they continue or are bothersome):  -constipation  -diarrhea  -hair loss  -loss of appetite  -stomach upset  This list may not describe all possible side effects. Call your doctor for medical advice about side effects. You may report side effects to FDA at 1-800-FDA-1088.  Where should I keep my medicine?  This drug is given in a hospital or clinic and will not be stored at home.  NOTE: This sheet is a summary. It may not cover all possible information. If you have questions about this medicine, talk to your doctor, pharmacist, or health care provider.   2018 Elsevier/Gold Standard (2008-02-01 18:45:54)  Nanoparticle Albumin-Bound Paclitaxel injection  What is this medicine?  NANOPARTICLE ALBUMIN-BOUND PACLITAXEL (Na no PAHR ti kuhl al BYOO muhn-bound PAK li TAX el) is a chemotherapy drug. It targets fast dividing cells, like cancer cells, and causes these cells to die. This medicine is used to treat advanced breast cancer and advanced lung cancer.  This medicine may be used for other purposes; ask your health care provider or pharmacist if you have questions.  COMMON BRAND NAME(S): Abraxane  What should I tell my health care provider before I take this medicine?  They need  to know if you have any of these conditions:  -kidney disease  -liver disease  -low blood counts, like low platelets, red blood cells, or white blood cells  -recent or ongoing radiation therapy  -an unusual or allergic reaction to paclitaxel, albumin, other chemotherapy, other medicines, foods, dyes, or preservatives  -pregnant or trying to get pregnant  -breast-feeding  How should I use this medicine?  This drug is given as an infusion into a vein. It is administered in a hospital or clinic by a specially trained health care professional.  Talk to your pediatrician regarding the use of this medicine in children. Special care may be needed.  Overdosage: If you think you have taken too much of this medicine contact a poison control center or emergency room at once.  NOTE: This medicine is only for you. Do not share this medicine with others.  What if I miss a dose?  It is important not to miss your dose. Call your doctor or health care professional if you are unable to keep an appointment.  What may interact with   this medicine?  -cyclosporine  -diazepam  -ketoconazole  -medicines to increase blood counts like filgrastim, pegfilgrastim, sargramostim  -other chemotherapy drugs like cisplatin, doxorubicin, epirubicin, etoposide, teniposide, vincristine  -quinidine  -testosterone  -vaccines  -verapamil  Talk to your doctor or health care professional before taking any of these medicines:  -acetaminophen  -aspirin  -ibuprofen  -ketoprofen  -naproxen  This list may not describe all possible interactions. Give your health care provider a list of all the medicines, herbs, non-prescription drugs, or dietary supplements you use. Also tell them if you smoke, drink alcohol, or use illegal drugs. Some items may interact with your medicine.  What should I watch for while using this medicine?  Your condition will be monitored carefully while you are receiving this medicine. You will need important blood work done while you are  taking this medicine.  This medicine can cause serious allergic reactions. If you experience allergic reactions like skin rash, itching or hives, swelling of the face, lips, or tongue, tell your doctor or health care professional right away.  In some cases, you may be given additional medicines to help with side effects. Follow all directions for their use.  This drug may make you feel generally unwell. This is not uncommon, as chemotherapy can affect healthy cells as well as cancer cells. Report any side effects. Continue your course of treatment even though you feel ill unless your doctor tells you to stop.  Call your doctor or health care professional for advice if you get a fever, chills or sore throat, or other symptoms of a cold or flu. Do not treat yourself. This drug decreases your body's ability to fight infections. Try to avoid being around people who are sick.  This medicine may increase your risk to bruise or bleed. Call your doctor or health care professional if you notice any unusual bleeding.  Be careful brushing and flossing your teeth or using a toothpick because you may get an infection or bleed more easily. If you have any dental work done, tell your dentist you are receiving this medicine.  Avoid taking products that contain aspirin, acetaminophen, ibuprofen, naproxen, or ketoprofen unless instructed by your doctor. These medicines may hide a fever.  Do not become pregnant while taking this medicine. Women should inform their doctor if they wish to become pregnant or think they might be pregnant. There is a potential for serious side effects to an unborn child. Talk to your health care professional or pharmacist for more information. Do not breast-feed an infant while taking this medicine.  Men are advised not to father a child while receiving this medicine.  What side effects may I notice from receiving this medicine?  Side effects that you should report to your doctor or health care  professional as soon as possible:  -allergic reactions like skin rash, itching or hives, swelling of the face, lips, or tongue  -low blood counts - This drug may decrease the number of white blood cells, red blood cells and platelets. You may be at increased risk for infections and bleeding.  -signs of infection - fever or chills, cough, sore throat, pain or difficulty passing urine  -signs of decreased platelets or bleeding - bruising, pinpoint red spots on the skin, black, tarry stools, nosebleeds  -signs of decreased red blood cells - unusually weak or tired, fainting spells, lightheadedness  -breathing problems  -changes in vision  -chest pain  -high or low blood pressure  -mouth sores  -nausea and vomiting  -  pain, swelling, redness or irritation at the injection site  -pain, tingling, numbness in the hands or feet  -slow or irregular heartbeat  -swelling of the ankle, feet, hands  Side effects that usually do not require medical attention (report to your doctor or health care professional if they continue or are bothersome):  -aches, pains  -changes in the color of fingernails  -diarrhea  -hair loss  -loss of appetite  This list may not describe all possible side effects. Call your doctor for medical advice about side effects. You may report side effects to FDA at 1-800-FDA-1088.  Where should I keep my medicine?  This drug is given in a hospital or clinic and will not be stored at home.  NOTE: This sheet is a summary. It may not cover all possible information. If you have questions about this medicine, talk to your doctor, pharmacist, or health care provider.   2018 Elsevier/Gold Standard (2015-07-25 10:05:20)

## 2016-12-15 ENCOUNTER — Telehealth: Payer: Self-pay | Admitting: Internal Medicine

## 2016-12-15 ENCOUNTER — Other Ambulatory Visit: Payer: Self-pay | Admitting: Internal Medicine

## 2016-12-15 NOTE — Telephone Encounter (Signed)
Spoke to Dr.MCree [phone]- pt admitted for MS changes- likley from fentanyl; also small PE on anti-coagulation. Pt to follow up tomorrow. Also discussed re: transfer of care to [facility].

## 2016-12-16 ENCOUNTER — Inpatient Hospital Stay: Payer: Medicare Other

## 2016-12-16 ENCOUNTER — Inpatient Hospital Stay (HOSPITAL_BASED_OUTPATIENT_CLINIC_OR_DEPARTMENT_OTHER): Payer: Medicare Other | Admitting: Internal Medicine

## 2016-12-16 ENCOUNTER — Encounter: Payer: Self-pay | Admitting: Internal Medicine

## 2016-12-16 VITALS — BP 127/85 | HR 102 | Temp 97.5°F | Resp 22 | Ht 64.0 in | Wt 177.0 lb

## 2016-12-16 DIAGNOSIS — C250 Malignant neoplasm of head of pancreas: Secondary | ICD-10-CM | POA: Diagnosis not present

## 2016-12-16 DIAGNOSIS — R630 Anorexia: Secondary | ICD-10-CM

## 2016-12-16 DIAGNOSIS — Z5111 Encounter for antineoplastic chemotherapy: Secondary | ICD-10-CM | POA: Diagnosis not present

## 2016-12-16 DIAGNOSIS — Z79899 Other long term (current) drug therapy: Secondary | ICD-10-CM

## 2016-12-16 DIAGNOSIS — M7989 Other specified soft tissue disorders: Secondary | ICD-10-CM

## 2016-12-16 DIAGNOSIS — C786 Secondary malignant neoplasm of retroperitoneum and peritoneum: Secondary | ICD-10-CM | POA: Diagnosis not present

## 2016-12-16 DIAGNOSIS — D701 Agranulocytosis secondary to cancer chemotherapy: Secondary | ICD-10-CM

## 2016-12-16 DIAGNOSIS — T451X5S Adverse effect of antineoplastic and immunosuppressive drugs, sequela: Secondary | ICD-10-CM

## 2016-12-16 DIAGNOSIS — T451X5A Adverse effect of antineoplastic and immunosuppressive drugs, initial encounter: Secondary | ICD-10-CM

## 2016-12-16 LAB — COMPREHENSIVE METABOLIC PANEL
ALT: 19 U/L (ref 14–54)
ANION GAP: 9 (ref 5–15)
AST: 32 U/L (ref 15–41)
Albumin: 3 g/dL — ABNORMAL LOW (ref 3.5–5.0)
Alkaline Phosphatase: 74 U/L (ref 38–126)
BUN: 7 mg/dL (ref 6–20)
CHLORIDE: 101 mmol/L (ref 101–111)
CO2: 25 mmol/L (ref 22–32)
CREATININE: 0.8 mg/dL (ref 0.44–1.00)
Calcium: 9.4 mg/dL (ref 8.9–10.3)
GFR calc Af Amer: >60 mL/min (ref >60)
Glucose, Bld: 110 mg/dL — ABNORMAL HIGH (ref 65–99)
POTASSIUM: 3.6 mmol/L (ref 3.5–5.1)
SODIUM: 135 mmol/L (ref 135–145)
Total Bilirubin: 2.8 mg/dL — ABNORMAL HIGH (ref 0.3–1.2)
Total Protein: 6.7 g/dL (ref 6.5–8.1)

## 2016-12-16 LAB — CBC WITH DIFFERENTIAL/PLATELET
Basophils Absolute: 0 10*3/uL (ref 0–0.1)
Basophils Relative: 0 %
EOS PCT: 8 %
Eosinophils Absolute: 0.1 10*3/uL (ref 0–0.7)
HCT: 24.7 % — ABNORMAL LOW (ref 35.0–47.0)
Hemoglobin: 8 g/dL — ABNORMAL LOW (ref 12.0–16.0)
LYMPHS ABS: 0.6 10*3/uL — AB (ref 1.0–3.6)
LYMPHS PCT: 34 %
MCH: 27.8 pg (ref 26.0–34.0)
MCHC: 32.4 g/dL (ref 32.0–36.0)
MCV: 85.7 fL (ref 80.0–100.0)
MONO ABS: 0.2 10*3/uL (ref 0.2–0.9)
Monocytes Relative: 13 %
Neutro Abs: 0.8 10*3/uL — ABNORMAL LOW (ref 1.4–6.5)
Neutrophils Relative %: 45 %
PLATELETS: 187 10*3/uL (ref 150–440)
RBC: 2.88 MIL/uL — ABNORMAL LOW (ref 3.80–5.20)
RDW: 15.4 % — AB (ref 11.5–14.5)
WBC: 1.7 10*3/uL — ABNORMAL LOW (ref 3.6–11.0)

## 2016-12-16 NOTE — Assessment & Plan Note (Addendum)
#   STAGE IV- Pancreatic adenocarcinoma- abutting the vasculature/ peripancreatic lymph nodes; likely omental/peritoneal nodularity- metastases based on imaging. Status post cycle #1 gem [800mg /2] Abraxane [100 mg/2]- every 2 weeks. Patient tolerated chemotherapy well however; recent admission the hospital for mental status changes [C discussion below].  # Proceed with cycle #2 in one week;   # Chemotherapy induced neutropenia white count 1.9 ANC 800 at onpro.   # Growth factor-Neulasta/On pro would be given as prophylaxis for chemotherapy-induced neutropenia to prevent febrile neutropenias.   # Mental status changes/admission the hospital secondary to fentanyl improved.  # Small PE on anticoagulation oral [incidental]  # Patient will be candidate for genetic testing given multiple malignancies-personal/ family history.   # Bilateral lower extremity swelling dependent edema recommend leg elevation/ stockings.  # Pain control- poor tolerance to fentanyl patch. Hence discontinued. Continue oxycodone as needed for pain.  # Loss of appetite/difficulty sleeping at night; continue Remeron.   # Discussed with Dr.McCree/ also discussed that she can transfer the care to [facility] at any point of time.  # follow up in 1 week/chemo/labs/MD.

## 2016-12-16 NOTE — Progress Notes (Signed)
Lower extremity weakness-affecting gait. Shuffles feet.  Reports Heaviness in legs-2+ bilateral leg edema present.   Patient wearing knee high compression stockings. Pt in w/c. High fall risk.  Unable to ambulate more than 1-2 feet without feeling short of breath.  Daughter reports that pt was Evaluated by [facility] this past weekend.   Pt presented to ed with altered mental status, which ED providers attributed to her fentanyl patch. Pt also dx with pulmonary emboli was prescribed eliquis 5 mg. Twice daily. Pt is still taking asa 81 mg and states that she was not told to discontinue the asa.  Pt reports frequent episodes of belching. Daughter gave pt 2 Rolaids while patient was in the exam room.  Pt c/o epigastric and left lower quadrant abdominal pain. Last took her oxycodone tablet at 7am this morning. Now rates abd. Pain at 6/10. "cramp, sore, guarding". Made worse with movement and cough. Nausea is relieved by antiemetics-zofran/compazine.

## 2016-12-16 NOTE — Progress Notes (Signed)
[facility] CONSULT NOTE  Patient Care Team: Ezequiel Kayser, MD as PCP - General (Internal Medicine)  CHIEF COMPLAINTS/PURPOSE OF CONSULTATION:  Pancreatic cancer  #  Oncology History   # FEB 2018- PANCREAS HEAD Adeno ca [mass encasing SMV, splenic Vein, proximal portal Vein];  EUS Bx-fibrous tissue suspicious for invasive adeno ca [Feb 12th 2018- 5 x 5cm; 3 peripancreatic LN- malignant appearing; no lesions in liver]; # Ca- 19-9 [>100K]; MARCH 2018- PET scan- ~5cm [? Vascular involvement; celiac trunk]; ? Omental mets; peritoneal nodularity;   # Obstructive jaundice [bilirubin 23 Feb 2nd 2018-s/p ERCP with stenting; brushing- inconclusive; [facility] feb 5th 2018]   # Uterine cancer ? Stage; s/p TAH & BSO [[facility] ? 2-3 years ago]       Malignant neoplasm of head of pancreas (HCC)     HISTORY OF PRESENTING ILLNESS:  Carolyn Adkins 82 y.o.  female recently diagnosed with pancreatic Adenocarcinoma- s/p cycle #1 gem-abraxane every 2 weeks. Patient received cycle #1 approximately 1 week ago.  In the interim patient was admitted to the hospital at [facility] for mental status changes. This is thought to be from fentanyl patch. Incidentally she was also found to have a small PE for which she is currently on anticoagulation. She was recommended patient was recommended skilled facility at discharge however she is currently at home with her sisters.  Patient is a poor appetite. Positive for weight loss. Positive for bilateral extremity; improves with leg elevation.  She states her pain is approximately 8 and a scale of 10. This improves when taking oxycodone. Skin no itching improving. Denies any diarrhea. Denies any tingling or numbness. Denies any chest pain.  ROS: A complete 10 point review of system is done which is negative except mentioned above in history of present illness  MEDICAL HISTORY:  Past Medical History:  Diagnosis Date  . Anemia 11/10/2016  . Arthritis  11/09/2016  . Cancer ([place])    uterine ca  . Carpal tunnel syndrome on right 08/09/2015  . Carpal tunnel syndrome, left 08/09/2015  . Essential hypertension 11/09/2016  . Hypercholesterolemia 11/10/2016  . Hypertension   . OA (osteoarthritis) of knee 11/10/2016  . Osteopenia 11/10/2016  . Pancreatic cancer ([place])   . Pancreatic mass 11/09/2016  . Primary osteoarthritis of first carpometacarpal joint of right hand 01/01/2016  . Pulmonary embolism ([place])   . Radiculopathy of lumbosacral region 09/12/2014  . Small bowel obstruction 02/03/2015  . Type 2 diabetes mellitus without complication, without long-term current use of insulin ([place]) 02/27/2014   Overview:  HgbA1c reached 6.5% 09/09/2016  . Vitamin D deficiency 11/10/2016    SURGICAL HISTORY: Past Surgical History:  Procedure Laterality Date  . ABDOMINAL HYSTERECTOMY    . PORTA CATH INSERTION N/A 12/08/2016   Procedure: Glori Luis Cath Insertion;  Surgeon: Algernon Huxley, MD;  Location: [facility];  Service: Cardiovascular;  Laterality: N/A;    SOCIAL HISTORY: Social History   Social History  . Marital status: Widowed    Spouse name: N/A  . Number of children: N/A  . Years of education: N/A   Occupational History  . Not on file.   Social History Main Topics  . Smoking status: Never Smoker  . Smokeless tobacco: Not on file  . Alcohol use Not on file  . Drug use: Unknown  . Sexual activity: Not on file   Other Topics Concern  . Not on file   Social History Narrative  . No  narrative on file    FAMILY HISTORY: mother- breast cancer- 30s [died at 69]; sister [? Cancer- 76s]; sister [uterus- 59s]; 7 girls; brother  One died on stomach aneursym.  Family History  Problem Relation Age of Onset  . Breast cancer Mother   . Cancer Sister   . Cancer Sister     uterine    ALLERGIES:  is allergic to fentanyl.  MEDICATIONS:  Current Outpatient Prescriptions  Medication Sig Dispense Refill  . acetaminophen (TYLENOL) 500 MG tablet Take  1,000 mg by mouth 2 (two) times daily.     Marland Kitchen apixaban (ELIQUIS) 5 MG TABS tablet Take 5 mg by mouth 2 (two) times daily.    Marland Kitchen aspirin EC 81 MG tablet Take 81 mg by mouth daily.     . camphor-menthol (SARNA) lotion Apply 1 application topically as needed for pain.    . Cholecalciferol (VITAMIN D3) 2000 units capsule Take 2,000 Units by mouth daily.    Marland Kitchen docusate sodium (COLACE) 100 MG capsule Take 100 mg by mouth daily as needed.    . doxepin (SINEQUAN) 10 MG capsule Take 10 mg by mouth. Take 2-3 tablets (20-30 mg) by mouth nightly as needed for itching    . fluticasone (FLONASE) 50 MCG/ACT nasal spray Place 1 spray into the nose daily as needed.     . lidocaine-prilocaine (EMLA) cream Apply 1 application topically as needed. 30 g 1  . mirtazapine (REMERON) 15 MG tablet Take 1 tablet (15 mg total) by mouth at bedtime. 30 tablet 3  . Multiple Vitamin (MULTIVITAMIN) tablet Take 1 tablet by mouth daily.    . Omega-3 Fatty Acids (FISH OIL) 1000 MG CAPS Take 1 capsule by mouth daily.     . ondansetron (ZOFRAN) 8 MG tablet Take 1 tablet (8 mg total) by mouth every 8 (eight) hours as needed for nausea or vomiting. 30 tablet 3  . oxyCODONE (OXY IR/ROXICODONE) 5 MG immediate release tablet Take 5 mg by mouth every 6 (six) hours as needed for pain.    Marland Kitchen prochlorperazine (COMPAZINE) 10 MG tablet Take 1 tablet (10 mg total) by mouth every 6 (six) hours as needed for nausea or vomiting. 30 tablet 3  . fentaNYL (DURAGESIC - DOSED MCG/HR) 25 MCG/HR patch Place 1 patch (25 mcg total) onto the skin every 3 (three) days. (Patient not taking: Reported on 12/16/2016) 10 patch 0   Current Facility-Administered Medications  Medication Dose Route Frequency Provider Last Rate Last Dose  . ceFAZolin (ANCEF) IVPB 1 g/50 mL premix  1 g Intravenous Once Algernon Huxley, MD      . gentamicin (GARAMYCIN) 80 mg in sodium chloride irrigation 0.9 % 500 mL irrigation   Irrigation Once Algernon Huxley, MD          .  PHYSICAL  EXAMINATION: ECOG PERFORMANCE STATUS: 2 - Symptomatic, <50% confined to bed  Vitals:   12/16/16 0848 12/16/16 0853  BP:  127/85  Pulse:  (!) 102  Resp: (!) 22   Temp:  97.5 F (36.4 C)   Filed Weights   12/16/16 0848  Weight: 177 lb (80.3 kg)    GENERAL: Well-nourished well-developed; Alert, no distress and comfortable.   Accompanied by her sister. She is in a wheel chair.  EYES: no pallor or icterus OROPHARYNX: no thrush or ulceration; good dentition  NECK: supple, no masses felt LYMPH:  no palpable lymphadenopathy in the cervical, axillary or inguinal regions LUNGS: clear to auscultation and  No wheeze or  crackles HEART/CVS: regular rate & rhythm and no murmurs; Bil LE lower extremity edema ABDOMEN: abdomen soft, non-tender and normal bowel sounds Musculoskeletal:no cyanosis of digits and no clubbing  PSYCH: alert & oriented x 3 with fluent speech NEURO: no focal motor/sensory deficits SKIN:  no rashes or significant lesions  LABORATORY DATA:  I have reviewed the data as listed Lab Results  Component Value Date   WBC 1.7 (L) 12/16/2016   HGB 8.0 (L) 12/16/2016   HCT 24.7 (L) 12/16/2016   MCV 85.7 12/16/2016   PLT 187 12/16/2016    Recent Labs  11/08/16 1151 12/02/16 1615 12/16/16 0837  NA 133* 138 135  K 2.8* 3.9 3.6  CL 96* 103 101  CO2 27 24 25   GLUCOSE 153* 107* 110*  BUN 19 11 7   CREATININE UNABLE TO REPORT DUE TO ICTERUS 0.82 0.80  CALCIUM 9.4 9.4 9.4  GFRNONAA NOT CALCULATED >60 >60  GFRAA NOT CALCULATED >60 >60  PROT 6.9 7.0 6.7  ALBUMIN 3.0* 2.9* 3.0*  AST 215* 35 32  ALT 220* 25 19  ALKPHOS 295* 98 74  BILITOT 27.9* 5.0* 2.8*    RADIOGRAPHIC STUDIES: I have personally reviewed the radiological images as listed and agreed with the findings in the report. Nm Pet Image Initial (pi) Skull Base To Thigh  Result Date: 12/04/2016 CLINICAL DATA:  Initial treatment strategy for pancreatic head malignancy. History of uterine cancer. EXAM: NUCLEAR  MEDICINE PET SKULL BASE TO THIGH TECHNIQUE: 12.4 mCi F-18 FDG was injected intravenously. Full-ring PET imaging was performed from the skull base to thigh after the radiotracer. CT data was obtained and used for attenuation correction and anatomic localization. FASTING BLOOD GLUCOSE:  Value: 88 mg/dl COMPARISON:  Multiple exams, including 11/08/2016 ultrasound and CT from 01/27/2015 FINDINGS: NECK No hypermetabolic lymph nodes in the neck. Multilevel cervical spondylosis and degenerative disc disease likely causing impingement. CHEST No hypermetabolic mediastinal or hilar nodes. No suspicious pulmonary nodules on the CT data. ABDOMEN/PELVIS An expandable biliary stent is present. There is in amorphous mass of the pancreatic body extending into the pancreatic head, the region of associated abnormal hypermetabolic activity measures approximately 5.3 cm, and has a maximum standard uptake value of 9.5. This mass skirts the medial margin of the stent wall anatomic localization of vascular structures is problematic on today' s noncontrast CT data, I suspect that the celiac trunk and its branches are encompassed by the mass. Indistinctness of surrounding tissue planes noted. Despite the stent there some mild intrahepatic biliary dilatation and a thickened appearance of the extrahepatic biliary tree. There is likely some periportal edema. I do not see a discrete hepatic metastatic lesion. High density material in the gallbladder, probably sludge or contrast medium. Mildly dilated dorsal pancreatic duct in the pancreatic tail. Nodularity in the gastrohepatic ligament could be from varices or lymph nodes. Poor delineation of tissue planes in the peripancreatic region. Diffuse mesenteric edema. A 1.2 by 1.7 cm nodular focus in the omentum at the midline of image 153/3 has a maximum standard uptake value of 4.8, concerning for a metastatic lesion. There some other small hypodense rounded structures which are not hypermetabolic  but could represent lymph nodes, including a 1.4 cm structure adjacent to the gallbladder on image 140/3 and a 1.2 cm in short axis structure adjacent to the anastomotic staple line in the right colon on image 154/3. There is diffuse mesenteric edema and some mild mesenteric nodularity. Mild pelvic ascites. Low-grade subcutaneous edema noted There is some accentuated  metabolic activity in the right colon along the anastomotic staple line there is a collection of gas and density along the right paracolic gutter which is probably from a ribbon light flattened loop of small bowel. There is some colon wall thickening in the right abdomen on image 152 of series 3 just distal to the anastomotic staple line, although this could be peristaltic. SKELETON Thoracic and lumbar spondylosis. Cervical spondylosis is thought to cause multilevel impingement. No compelling findings for osseous metastatic disease. IMPRESSION: 1. Hypermetabolic activity associated with the pancreatic head and body mass measures about 5.3 cm in diameter with maximum SUV 9.5. I would suspect the celiac trunk and its branches to be encompassed by the mass, although today's exam does not constitute a detailed vascular anatomic assessment. 2. There is a hypermetabolic nodular focus in the supraumbilical omentum suspicious for a metastatic focus. Other small nodular densities along the peritoneal margin are not overtly hypermetabolic. 3. Diffuse mesenteric edema with mild pelvic ascites. Indistinct fat planes along the porta hepatis. 4. Apparent wall thickening of the extrahepatic biliary tree extending to the expandable stent. Mild intrahepatic biliary dilatation is suspected. 5. Considerable cervical spondylosis and degenerative disc disease likely causing multilevel impingement. Electronically Signed   By: Van Clines M.D.   On: 12/04/2016 10:30   US Venous Img Lower Bilateral  Result Date: 12/04/2016 CLINICAL DATA:  Swelling for about 1 month  EXAM: BILATERAL LOWER EXTREMITY VENOUS DOPPLER ULTRASOUND TECHNIQUE: Gray-scale sonography with graded compression, as well as color Doppler and duplex ultrasound were performed to evaluate the lower extremity deep venous systems from the level of the common femoral vein and including the common femoral, femoral, profunda femoral, popliteal and calf veins including the posterior tibial, peroneal and gastrocnemius veins when visible. The superficial great saphenous vein was also interrogated. Spectral Doppler was utilized to evaluate flow at rest and with distal augmentation maneuvers in the common femoral, femoral and popliteal veins. COMPARISON:  None. FINDINGS: RIGHT LOWER EXTREMITY Common Femoral Vein: No evidence of thrombus. Normal compressibility, respiratory phasicity and response to augmentation. Saphenofemoral Junction: No evidence of thrombus. Normal compressibility and flow on color Doppler imaging. Profunda Femoral Vein: No evidence of thrombus. Normal compressibility and flow on color Doppler imaging. Femoral Vein: No evidence of thrombus. Normal compressibility, respiratory phasicity and response to augmentation. Popliteal Vein: No evidence of thrombus. Normal compressibility, respiratory phasicity and response to augmentation. Calf Veins: No evidence of thrombus. Normal compressibility and flow on color Doppler imaging. Superficial Great Saphenous Vein: No evidence of thrombus. Normal compressibility and flow on color Doppler imaging. Venous Reflux:  None. Other Findings:  None. LEFT LOWER EXTREMITY Common Femoral Vein: No evidence of thrombus. Normal compressibility, respiratory phasicity and response to augmentation. Saphenofemoral Junction: No evidence of thrombus. Normal compressibility and flow on color Doppler imaging. Profunda Femoral Vein: No evidence of thrombus. Normal compressibility and flow on color Doppler imaging. Femoral Vein: No evidence of thrombus. Normal compressibility,  respiratory phasicity and response to augmentation. Popliteal Vein: No evidence of thrombus. Normal compressibility, respiratory phasicity and response to augmentation. Calf Veins: No evidence of thrombus. Normal compressibility and flow on color Doppler imaging. Superficial Great Saphenous Vein: No evidence of thrombus. Normal compressibility and flow on color Doppler imaging. Venous Reflux:  None. Other Findings:  None. IMPRESSION: No evidence of deep venous thrombosis bilateral lower extremity. Electronically Signed   By: Lahoma Crocker M.D.   On: 12/04/2016 13:00    ASSESSMENT & PLAN:   Malignant neoplasm of head of  pancreas ([place]) # STAGE IV- Pancreatic adenocarcinoma- abutting the vasculature/ peripancreatic lymph nodes; likely omental/peritoneal nodularity- metastases based on imaging. Status post cycle #1 gem [800mg /2] Abraxane [100 mg/2]- every 2 weeks. Patient tolerated chemotherapy well however; recent admission the hospital for mental status changes [C discussion below].  # Proceed with cycle #2 in one week;   # Chemotherapy induced neutropenia white count 1.9 ANC 800 at onpro.   # Growth factor-Neulasta/On pro would be given as prophylaxis for chemotherapy-induced neutropenia to prevent febrile neutropenias.   # Mental status changes/admission the hospital secondary to fentanyl improved.  # Small PE on anticoagulation oral [incidental]  # Patient will be candidate for genetic testing given multiple malignancies-personal/ family history.   # Bilateral lower extremity swelling dependent edema recommend leg elevation/ stockings.  # Pain control- poor tolerance to fentanyl patch. Hence discontinued. Continue oxycodone as needed for pain.  # Loss of appetite/difficulty sleeping at night; continue Remeron.   # Discussed with Dr.McCree/ also discussed that she can transfer the care to [facility] at any point of time.  # follow up in 1 week/chemo/labs/MD.       Cammie Sickle, MD 12/16/2016 9:45 AM

## 2016-12-23 ENCOUNTER — Ambulatory Visit: Payer: Medicare Other | Admitting: Internal Medicine

## 2016-12-23 ENCOUNTER — Inpatient Hospital Stay: Payer: Medicare Other

## 2016-12-23 ENCOUNTER — Inpatient Hospital Stay: Payer: Medicare Other | Admitting: Internal Medicine

## 2016-12-24 ENCOUNTER — Inpatient Hospital Stay: Payer: Medicare Other

## 2018-01-04 DEATH — deceased

## 2018-09-02 IMAGING — CT NM PET TUM IMG INITIAL (PI) SKULL BASE T - THIGH
1 of 10 series · 1 of 25 positions shown · non-contrast
Comparison: Multiple exams, including 11/08/2016 ultrasound and CT
from 01/27/2015

CLINICAL DATA: Initial treatment strategy for pancreatic head
malignancy. History of uterine cancer..

EXAM:
NUCLEAR MEDICINE PET SKULL BASE TO THIGH
TECHNIQUE: 12.4 mCi F-18 FDG was injected intravenously. Full-ring PET imaging
was performed from the skull base to thigh after the radiotracer. CT
data was obtained and used for attenuation correction and anatomic
localization.
FASTING BLOOD GLUCOSE:  Value: 88 mg/dl

[Series 3: ct wb 5.0 b30f · axial · 5.0mm · 0.98mm/px · 1 of 290 slices shown]
[im 290/290  brain]
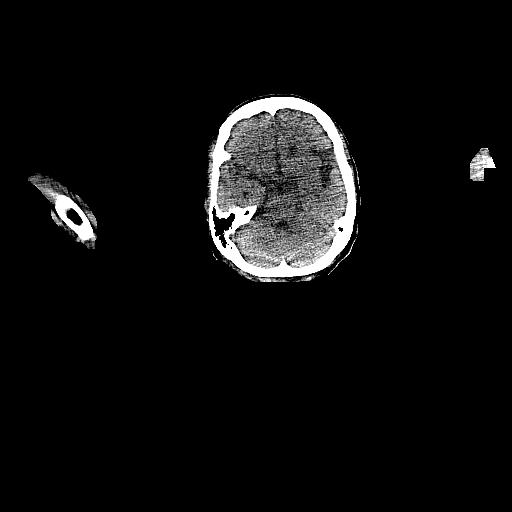

[1 of 25 positions shown; findings below may reference images not displayed]

FINDINGS: NECK

No hypermetabolic lymph nodes in the neck. Multilevel cervical
spondylosis and degenerative disc disease likely causing
impingement.

CHEST

No hypermetabolic mediastinal or hilar nodes. No suspicious
pulmonary nodules on the CT data.

ABDOMEN/PELVIS

An expandable biliary stent is present. There is in amorphous mass
of the pancreatic body extending into the pancreatic head, the
region of associated abnormal hypermetabolic activity measures
approximately 5.3 cm, and has a maximum standard uptake value of
9.5. This mass skirts the medial margin of the stent wall anatomic
localization of vascular structures is problematic on today' s
noncontrast CT data, I suspect that the celiac trunk and its
branches are encompassed by the mass. Indistinctness of surrounding
tissue planes noted. Despite the stent there some mild intrahepatic
biliary dilatation and a thickened appearance of the extrahepatic
biliary tree. There is likely some periportal edema.

I do not see a discrete hepatic metastatic lesion. High density
material in the gallbladder, probably sludge or contrast medium.
Mildly dilated dorsal pancreatic duct in the pancreatic tail.

Nodularity in the gastrohepatic ligament could be from varices or
lymph nodes. Poor delineation of tissue planes in the peripancreatic
region. Diffuse mesenteric edema.

A 1.2 by 1.7 cm nodular focus in the omentum at the midline of image
153/3 has a maximum standard uptake value of 4.8, concerning for a
metastatic lesion. There some other small hypodense rounded
structures which are not hypermetabolic but could represent lymph
nodes, including a 1.4 cm structure adjacent to the gallbladder on
image 140/3 and a 1.2 cm in short axis structure adjacent to the
anastomotic staple line in the right colon on image 154/3. There is
diffuse mesenteric edema and some mild mesenteric nodularity. Mild
pelvic ascites. Low-grade subcutaneous edema noted

There is some accentuated metabolic activity in the right colon
along the anastomotic staple line there is a collection of gas and
density along the right paracolic gutter which is probably from a
ribbon light flattened loop of small bowel. There is some colon wall
thickening in the right abdomen on image 152 of series 3 just distal
to the anastomotic staple line, although this could be peristaltic.

SKELETON

Thoracic and lumbar spondylosis. Cervical spondylosis is thought to
cause multilevel impingement. No compelling findings for osseous
metastatic disease.
IMPRESSION: 1. Hypermetabolic activity associated with the pancreatic head and
body mass measures about 5.3 cm in diameter with maximum SUV 9.5. I
would suspect the celiac trunk and its branches to be encompassed by
the mass, although today's exam does not constitute a detailed
vascular anatomic assessment.
2. There is a hypermetabolic nodular focus in the supraumbilical
omentum suspicious for a metastatic focus. Other small nodular
densities along the peritoneal margin are not overtly
hypermetabolic.
3. Diffuse mesenteric edema with mild pelvic ascites. Indistinct fat
planes along the porta hepatis.
4. Apparent wall thickening of the extrahepatic biliary tree
extending to the expandable stent. Mild intrahepatic biliary
dilatation is suspected.
5. Considerable cervical spondylosis and degenerative disc disease
likely causing multilevel impingement.

## 2018-11-16 ENCOUNTER — Other Ambulatory Visit: Payer: Self-pay | Admitting: Nurse Practitioner

## 2018-12-29 IMAGING — US US ABDOMEN LIMITED
1 series · 14 of 25 positions shown · non-contrast
Comparison: CT abdomen dated 01/27/2015.

CLINICAL DATA: Jaundice.

EXAM:
US ABDOMEN LIMITED - RIGHT UPPER QUADRANT

[Series 1: us abdomen limited · 0.20mm/px · 14 of 51 slices shown]
[im 1/51]
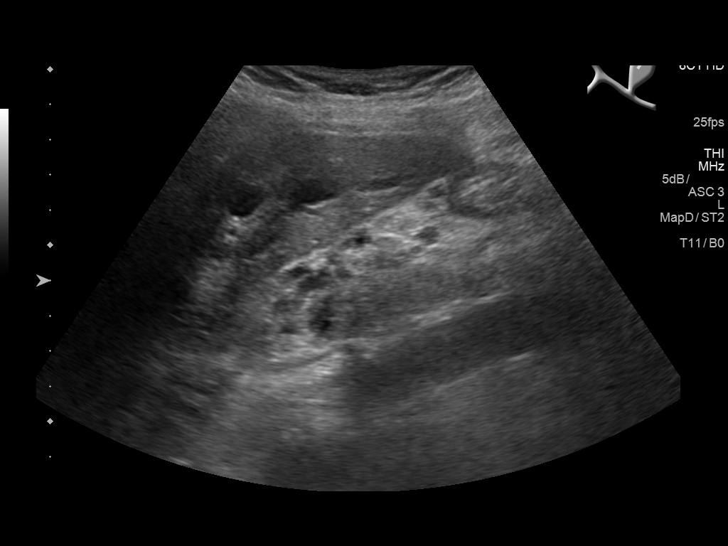
[im 5/51]
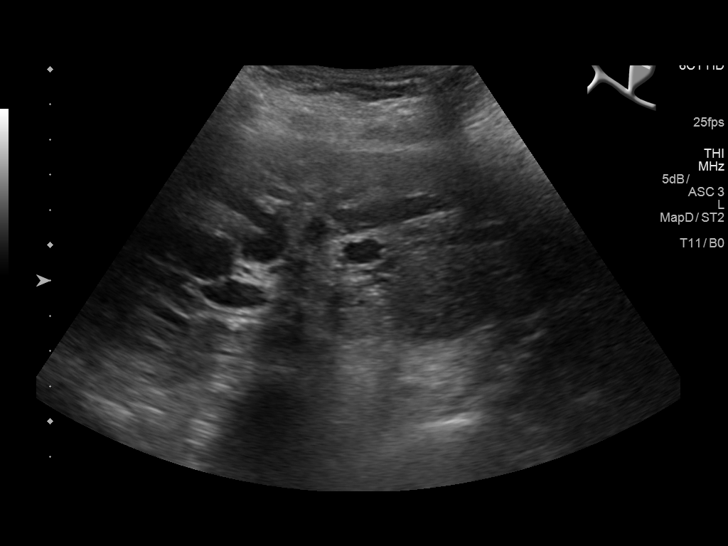
[im 9/51]
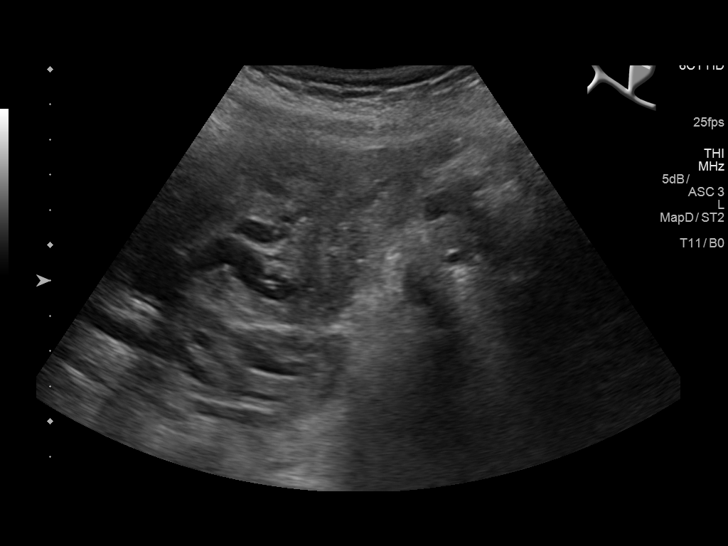
[im 13/51]
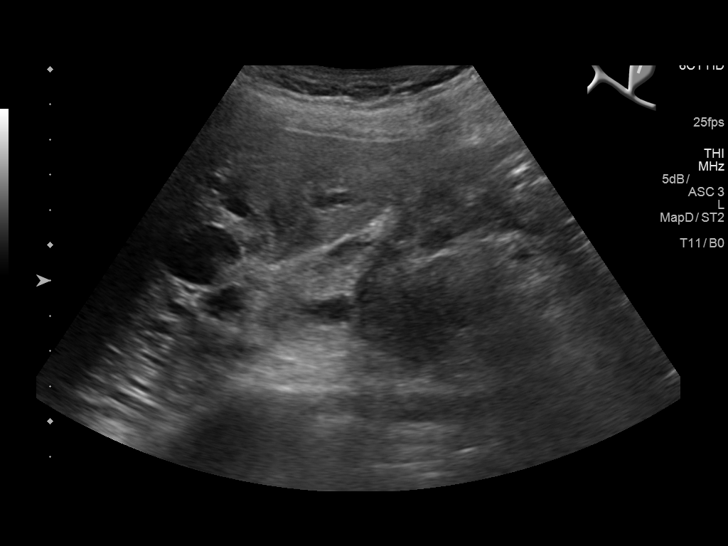
[im 17/51]
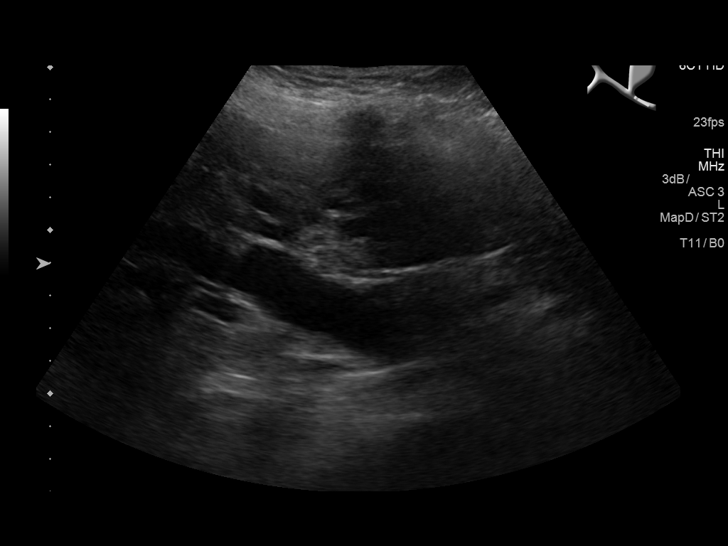
[im 19/51]
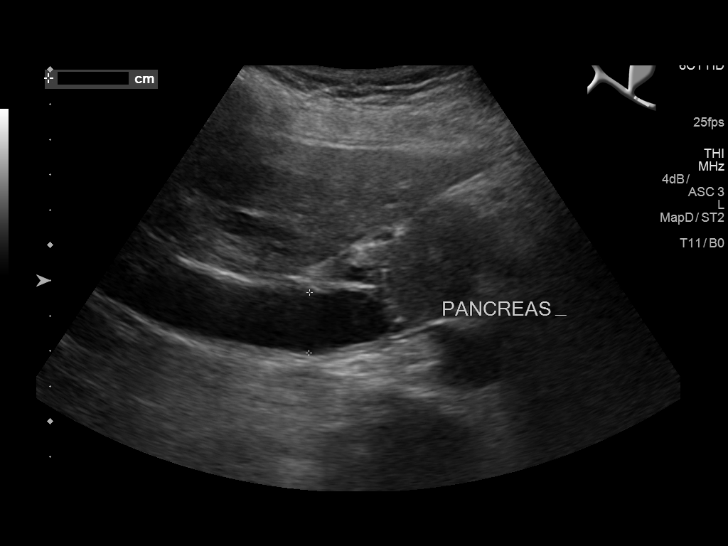
[im 23/51]
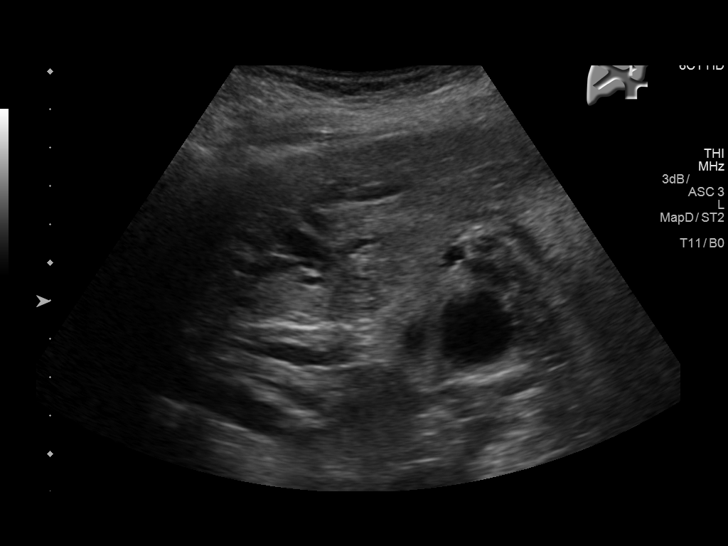
[im 28/51]
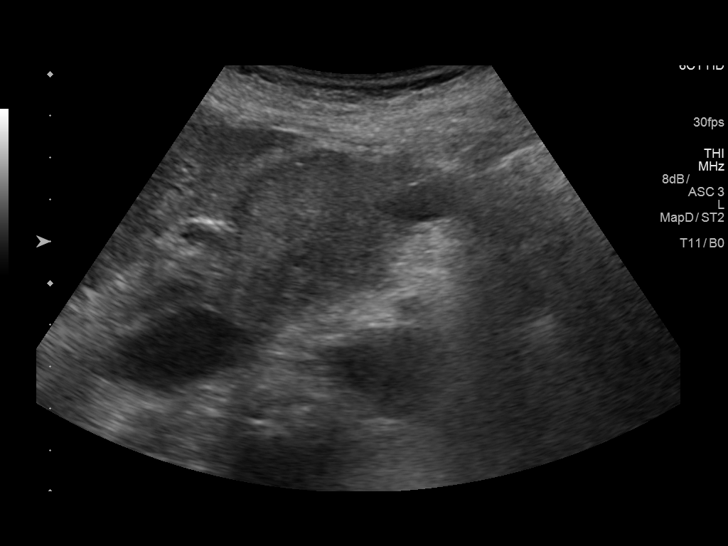
[im 32/51]
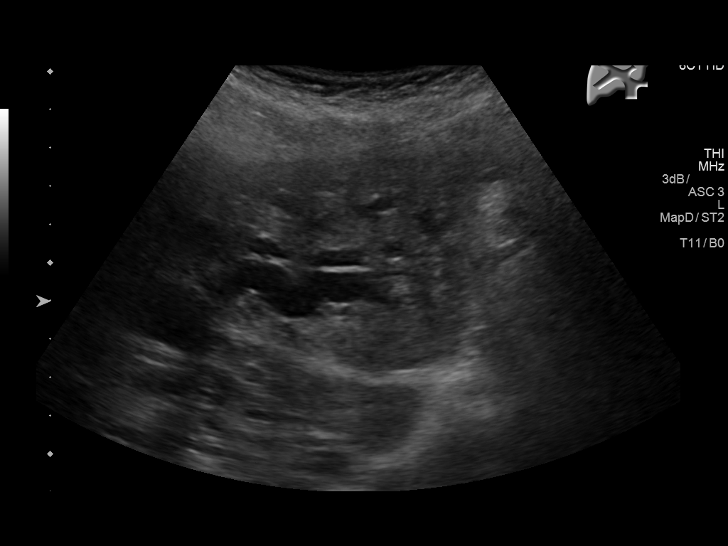
[im 34/51]
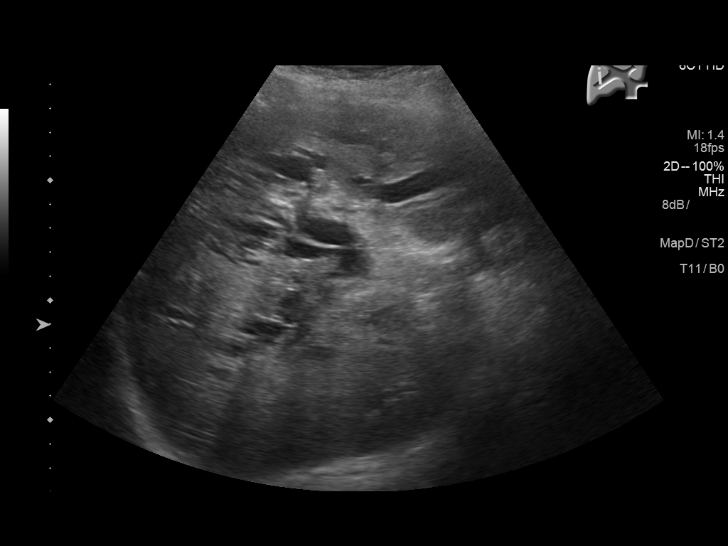
[im 38/51]
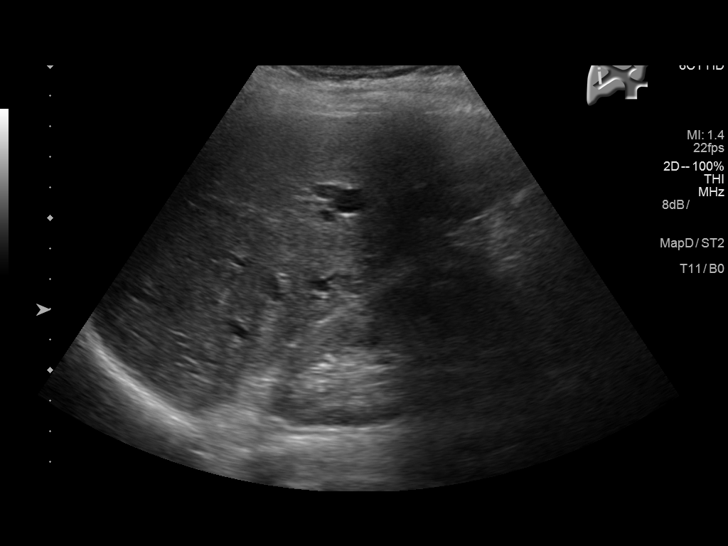
[im 42/51]
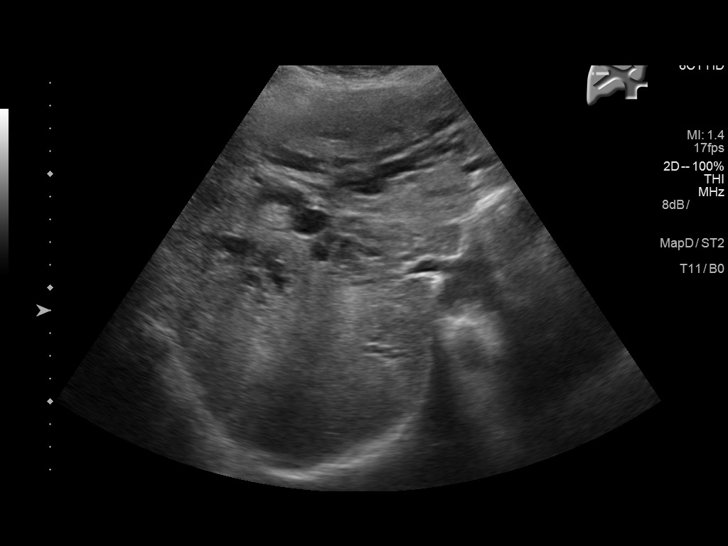
[im 46/51]
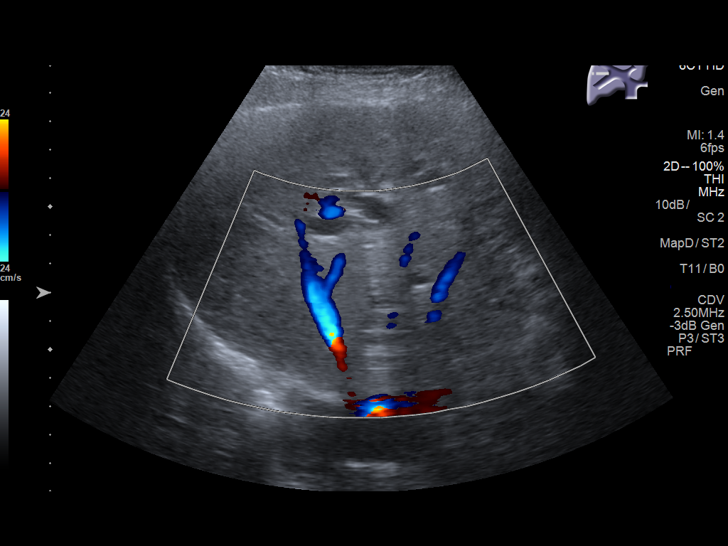
[im 51/51]
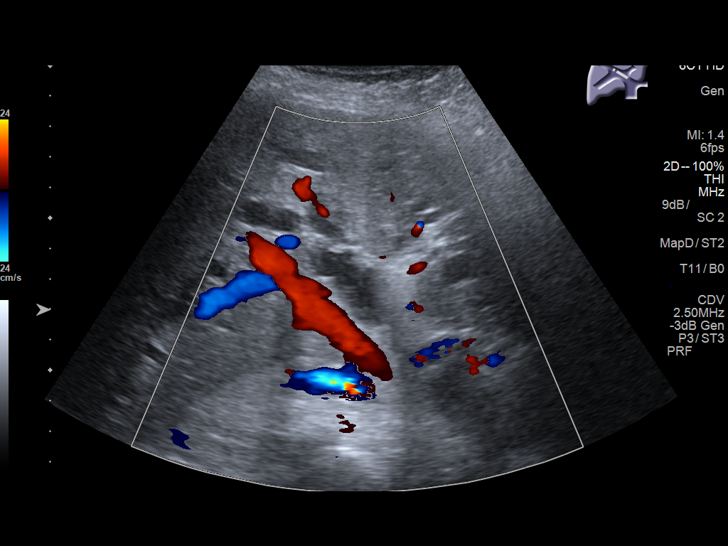

[14 of 25 positions shown; findings below may reference images not displayed]

FINDINGS: Gallbladder:

Not seen (contracted? ).

Common bile duct:

Diameter: Markedly dilated, measuring approximately 16-17 mm
diameter. This is new dilatation compared to the earlier CT of
01/27/2015. No obstructing stone identified. Suspect obstructing
mass within the pancreatic head

Liver:

No focal lesion identified. Within normal limits in parenchymal
echogenicity. Intrahepatic bile duct dilatation.
IMPRESSION: Marked CBD and intrahepatic bile duct dilatation, new compared to a
CT of 01/27/2015. The pancreatic head has an abnormal
rounded/bulbous appearance, suspected neoplastic mass in the
pancreatic head causing the bile duct dilatation. Recommend further
characterization with MRCP or ERCP.

Gallbladder not seen (contracted? ).
